# Patient Record
Sex: Female | Born: 1955 | Race: White | Hispanic: No | Marital: Married | Smoking: Never smoker
Health system: Southern US, Community
[De-identification: ages and names within clinical notes are randomized; demographics above are authoritative.]

## PROBLEM LIST (undated history)

## (undated) DIAGNOSIS — K219 Gastro-esophageal reflux disease without esophagitis: Secondary | ICD-10-CM

## (undated) DIAGNOSIS — I10 Essential (primary) hypertension: Secondary | ICD-10-CM

## (undated) DIAGNOSIS — Z9889 Other specified postprocedural states: Secondary | ICD-10-CM

## (undated) HISTORY — DX: Gastro-esophageal reflux disease without esophagitis: K21.9

## (undated) HISTORY — DX: Essential (primary) hypertension: I10

## (undated) HISTORY — PX: CHOLECYSTECTOMY: SHX55

## (undated) HISTORY — PX: OTHER SURGICAL HISTORY: SHX169

---

## 2002-12-10 ENCOUNTER — Other Ambulatory Visit: Admission: RE | Admit: 2002-12-10 | Discharge: 2002-12-10 | Payer: Self-pay | Admitting: Gynecology

## 2003-01-13 ENCOUNTER — Encounter: Admission: RE | Admit: 2003-01-13 | Discharge: 2003-01-13 | Payer: Self-pay | Admitting: Gynecology

## 2003-01-13 ENCOUNTER — Encounter: Payer: Self-pay | Admitting: Gynecology

## 2003-07-16 ENCOUNTER — Encounter: Admission: RE | Admit: 2003-07-16 | Discharge: 2003-07-16 | Payer: Self-pay | Admitting: Obstetrics and Gynecology

## 2003-11-28 ENCOUNTER — Ambulatory Visit (HOSPITAL_COMMUNITY): Admission: RE | Admit: 2003-11-28 | Discharge: 2003-11-28 | Payer: Self-pay

## 2008-08-18 ENCOUNTER — Ambulatory Visit: Payer: Self-pay | Admitting: Diagnostic Radiology

## 2008-08-18 ENCOUNTER — Encounter: Payer: Self-pay | Admitting: Emergency Medicine

## 2008-08-19 ENCOUNTER — Inpatient Hospital Stay (HOSPITAL_COMMUNITY): Admission: EM | Admit: 2008-08-19 | Discharge: 2008-08-23 | Payer: Self-pay | Admitting: Internal Medicine

## 2008-08-22 ENCOUNTER — Encounter (INDEPENDENT_AMBULATORY_CARE_PROVIDER_SITE_OTHER): Payer: Self-pay | Admitting: Surgery

## 2010-11-27 LAB — DIFFERENTIAL
Basophils Absolute: 0.1 10*3/uL (ref 0.0–0.1)
Basophils Absolute: 0.1 10*3/uL (ref 0.0–0.1)
Basophils Absolute: 0.1 10*3/uL (ref 0.0–0.1)
Basophils Absolute: 0.6 10*3/uL — ABNORMAL HIGH (ref 0.0–0.1)
Basophils Relative: 0 % (ref 0–1)
Basophils Relative: 2 % — ABNORMAL HIGH (ref 0–1)
Eosinophils Absolute: 0 10*3/uL (ref 0.0–0.7)
Eosinophils Absolute: 0 10*3/uL (ref 0.0–0.7)
Eosinophils Relative: 0 % (ref 0–5)
Eosinophils Relative: 0 % (ref 0–5)
Eosinophils Relative: 0 % (ref 0–5)
Lymphocytes Relative: 4 % — ABNORMAL LOW (ref 12–46)
Lymphocytes Relative: 4 % — ABNORMAL LOW (ref 12–46)
Lymphocytes Relative: 5 % — ABNORMAL LOW (ref 12–46)
Lymphocytes Relative: 7 % — ABNORMAL LOW (ref 12–46)
Lymphs Abs: 0.8 10*3/uL (ref 0.7–4.0)
Lymphs Abs: 1 10*3/uL (ref 0.7–4.0)
Lymphs Abs: 1 10*3/uL (ref 0.7–4.0)
Lymphs Abs: 1.2 10*3/uL (ref 0.7–4.0)
Monocytes Absolute: 0.6 10*3/uL (ref 0.1–1.0)
Monocytes Absolute: 0.6 10*3/uL (ref 0.1–1.0)
Monocytes Relative: 2 % — ABNORMAL LOW (ref 3–12)
Monocytes Relative: 3 % (ref 3–12)
Neutro Abs: 13.2 10*3/uL — ABNORMAL HIGH (ref 1.7–7.7)
Neutro Abs: 13.3 10*3/uL — ABNORMAL HIGH (ref 1.7–7.7)
Neutro Abs: 20.5 10*3/uL — ABNORMAL HIGH (ref 1.7–7.7)
Neutro Abs: 27.6 10*3/uL — ABNORMAL HIGH (ref 1.7–7.7)
Neutrophils Relative %: 89 % — ABNORMAL HIGH (ref 43–77)
Neutrophils Relative %: 92 % — ABNORMAL HIGH (ref 43–77)
Neutrophils Relative %: 92 % — ABNORMAL HIGH (ref 43–77)

## 2010-11-27 LAB — COMPREHENSIVE METABOLIC PANEL
ALT: 19 U/L (ref 0–35)
ALT: 23 U/L (ref 0–35)
ALT: 36 U/L — ABNORMAL HIGH (ref 0–35)
ALT: 36 U/L — ABNORMAL HIGH (ref 0–35)
AST: 104 U/L — ABNORMAL HIGH (ref 0–37)
AST: 28 U/L (ref 0–37)
AST: 43 U/L — ABNORMAL HIGH (ref 0–37)
Albumin: 2.9 g/dL — ABNORMAL LOW (ref 3.5–5.2)
Albumin: 3.5 g/dL (ref 3.5–5.2)
Albumin: 4.9 g/dL (ref 3.5–5.2)
Alkaline Phosphatase: 146 U/L — ABNORMAL HIGH (ref 39–117)
Alkaline Phosphatase: 68 U/L (ref 39–117)
Alkaline Phosphatase: 74 U/L (ref 39–117)
Alkaline Phosphatase: 90 U/L (ref 39–117)
BUN: 11 mg/dL (ref 6–23)
BUN: 12 mg/dL (ref 6–23)
BUN: 5 mg/dL — ABNORMAL LOW (ref 6–23)
BUN: 7 mg/dL (ref 6–23)
CO2: 22 mEq/L (ref 19–32)
CO2: 25 mEq/L (ref 19–32)
CO2: 27 mEq/L (ref 19–32)
CO2: 27 mEq/L (ref 19–32)
Calcium: 10.4 mg/dL (ref 8.4–10.5)
Calcium: 7.3 mg/dL — ABNORMAL LOW (ref 8.4–10.5)
Calcium: 8.3 mg/dL — ABNORMAL LOW (ref 8.4–10.5)
Chloride: 102 mEq/L (ref 96–112)
Chloride: 109 mEq/L (ref 96–112)
Chloride: 95 mEq/L — ABNORMAL LOW (ref 96–112)
Chloride: 98 mEq/L (ref 96–112)
Creatinine, Ser: 0.87 mg/dL (ref 0.4–1.2)
Creatinine, Ser: 0.93 mg/dL (ref 0.4–1.2)
Creatinine, Ser: 1.1 mg/dL (ref 0.4–1.2)
GFR calc Af Amer: >60 mL/min (ref >60)
GFR calc Af Amer: >60 mL/min (ref >60)
GFR calc Af Amer: >60 mL/min (ref >60)
GFR calc non Af Amer: 52 mL/min — ABNORMAL LOW (ref >60)
GFR calc non Af Amer: >60 mL/min (ref >60)
GFR calc non Af Amer: >60 mL/min (ref >60)
GFR calc non Af Amer: >60 mL/min (ref >60)
Glucose, Bld: 117 mg/dL — ABNORMAL HIGH (ref 70–99)
Glucose, Bld: 149 mg/dL — ABNORMAL HIGH (ref 70–99)
Glucose, Bld: 166 mg/dL — ABNORMAL HIGH (ref 70–99)
Glucose, Bld: 203 mg/dL — ABNORMAL HIGH (ref 70–99)
Potassium: 3.1 mEq/L — ABNORMAL LOW (ref 3.5–5.1)
Potassium: 3.2 mEq/L — ABNORMAL LOW (ref 3.5–5.1)
Potassium: 3.3 mEq/L — ABNORMAL LOW (ref 3.5–5.1)
Potassium: 4.2 mEq/L (ref 3.5–5.1)
Sodium: 135 mEq/L (ref 135–145)
Sodium: 136 mEq/L (ref 135–145)
Sodium: 138 mEq/L (ref 135–145)
Sodium: 139 mEq/L (ref 135–145)
Total Bilirubin: 0.6 mg/dL (ref 0.3–1.2)
Total Bilirubin: 0.9 mg/dL (ref 0.3–1.2)
Total Bilirubin: 1.1 mg/dL (ref 0.3–1.2)
Total Bilirubin: 1.6 mg/dL — ABNORMAL HIGH (ref 0.3–1.2)
Total Protein: 5.8 g/dL — ABNORMAL LOW (ref 6.0–8.3)
Total Protein: 6.4 g/dL (ref 6.0–8.3)
Total Protein: 9 g/dL — ABNORMAL HIGH (ref 6.0–8.3)

## 2010-11-27 LAB — CBC
HCT: 29.2 % — ABNORMAL LOW (ref 36.0–46.0)
HCT: 29.8 % — ABNORMAL LOW (ref 36.0–46.0)
HCT: 30.9 % — ABNORMAL LOW (ref 36.0–46.0)
HCT: 36.1 % (ref 36.0–46.0)
HCT: 39.7 % (ref 36.0–46.0)
HCT: 46.7 % — ABNORMAL HIGH (ref 36.0–46.0)
Hemoglobin: 10 g/dL — ABNORMAL LOW (ref 12.0–15.0)
Hemoglobin: 10.3 g/dL — ABNORMAL LOW (ref 12.0–15.0)
Hemoglobin: 10.9 g/dL — ABNORMAL LOW (ref 12.0–15.0)
Hemoglobin: 12.4 g/dL (ref 12.0–15.0)
Hemoglobin: 13.4 g/dL (ref 12.0–15.0)
Hemoglobin: 15.6 g/dL — ABNORMAL HIGH (ref 12.0–15.0)
MCHC: 33.3 g/dL (ref 30.0–36.0)
MCHC: 33.7 g/dL (ref 30.0–36.0)
MCHC: 34.2 g/dL (ref 30.0–36.0)
MCHC: 34.4 g/dL (ref 30.0–36.0)
MCV: 90.1 fL (ref 78.0–100.0)
MCV: 91.1 fL (ref 78.0–100.0)
MCV: 91.2 fL (ref 78.0–100.0)
MCV: 91.2 fL (ref 78.0–100.0)
Platelets: 176 10*3/uL (ref 150–400)
Platelets: 200 10*3/uL (ref 150–400)
Platelets: 213 10*3/uL (ref 150–400)
Platelets: 290 10*3/uL (ref 150–400)
Platelets: 388 10*3/uL (ref 150–400)
RBC: 3.22 MIL/uL — ABNORMAL LOW (ref 3.87–5.11)
RBC: 3.27 MIL/uL — ABNORMAL LOW (ref 3.87–5.11)
RBC: 3.96 MIL/uL (ref 3.87–5.11)
RBC: 4.36 MIL/uL (ref 3.87–5.11)
RBC: 5.18 MIL/uL — ABNORMAL HIGH (ref 3.87–5.11)
RDW: 11.7 % (ref 11.5–15.5)
RDW: 12.4 % (ref 11.5–15.5)
RDW: 12.6 % (ref 11.5–15.5)
RDW: 12.6 % (ref 11.5–15.5)
RDW: 12.7 % (ref 11.5–15.5)
RDW: 12.7 % (ref 11.5–15.5)
WBC: 14.8 10*3/uL — ABNORMAL HIGH (ref 4.0–10.5)
WBC: 14.8 10*3/uL — ABNORMAL HIGH (ref 4.0–10.5)
WBC: 17.1 10*3/uL — ABNORMAL HIGH (ref 4.0–10.5)
WBC: 22.2 10*3/uL — ABNORMAL HIGH (ref 4.0–10.5)
WBC: 30 10*3/uL — ABNORMAL HIGH (ref 4.0–10.5)

## 2010-11-27 LAB — URINE CULTURE
Colony Count: >=100000
Colony Count: NO GROWTH
Colony Count: NO GROWTH
Culture: NO GROWTH
Culture: NO GROWTH
Special Requests: NEGATIVE
Special Requests: NEGATIVE
Special Requests: POSITIVE

## 2010-11-27 LAB — CARDIAC PANEL(CRET KIN+CKTOT+MB+TROPI)
CK, MB: 1.4 ng/mL (ref 0.3–4.0)
CK, MB: 1.5 ng/mL (ref 0.3–4.0)
Relative Index: INVALID (ref 0.0–2.5)
Relative Index: INVALID (ref 0.0–2.5)
Total CK: 73 U/L (ref 7–177)
Total CK: 84 U/L (ref 7–177)
Troponin I: 0.01 ng/mL (ref 0.00–0.06)
Troponin I: 0.01 ng/mL (ref 0.00–0.06)

## 2010-11-27 LAB — URINALYSIS, ROUTINE W REFLEX MICROSCOPIC
Glucose, UA: NEGATIVE mg/dL
Ketones, ur: 15 mg/dL — AB
Nitrite: NEGATIVE
Protein, ur: >300 mg/dL — AB
Specific Gravity, Urine: 1.019 (ref 1.005–1.030)
Urobilinogen, UA: 1 mg/dL (ref 0.0–1.0)
pH: 6 (ref 5.0–8.0)

## 2010-11-27 LAB — URINE MICROSCOPIC-ADD ON

## 2010-11-27 LAB — BASIC METABOLIC PANEL
BUN: 5 mg/dL — ABNORMAL LOW (ref 6–23)
CO2: 22 mEq/L (ref 19–32)
Calcium: 6.6 mg/dL — ABNORMAL LOW (ref 8.4–10.5)
Calcium: 7 mg/dL — ABNORMAL LOW (ref 8.4–10.5)
Chloride: 105 mEq/L (ref 96–112)
Creatinine, Ser: 0.69 mg/dL (ref 0.4–1.2)
GFR calc Af Amer: >60 mL/min (ref >60)
GFR calc non Af Amer: >60 mL/min (ref >60)
GFR calc non Af Amer: >60 mL/min (ref >60)
Glucose, Bld: 114 mg/dL — ABNORMAL HIGH (ref 70–99)
Glucose, Bld: 125 mg/dL — ABNORMAL HIGH (ref 70–99)
Potassium: 2.7 mEq/L — CL (ref 3.5–5.1)
Potassium: 3.2 mEq/L — ABNORMAL LOW (ref 3.5–5.1)
Sodium: 134 mEq/L — ABNORMAL LOW (ref 135–145)
Sodium: 134 mEq/L — ABNORMAL LOW (ref 135–145)

## 2010-11-27 LAB — TSH: TSH: 0.857 u[IU]/mL (ref 0.350–4.500)

## 2010-11-27 LAB — CULTURE, BLOOD (ROUTINE X 2)
Culture: NO GROWTH
Culture: NO GROWTH

## 2010-11-27 LAB — LIPID PANEL
Cholesterol: 239 mg/dL — ABNORMAL HIGH (ref 0–200)
HDL: 69 mg/dL (ref >39)
LDL Cholesterol: 154 mg/dL — ABNORMAL HIGH (ref 0–99)
Total CHOL/HDL Ratio: 3.5 RATIO
Triglycerides: 79 mg/dL (ref <150)
VLDL: 16 mg/dL (ref 0–40)

## 2010-11-27 LAB — POTASSIUM: Potassium: 2.9 mEq/L — ABNORMAL LOW (ref 3.5–5.1)

## 2010-11-27 LAB — LIPASE, BLOOD
Lipase: 119 U/L — ABNORMAL HIGH (ref 11–59)
Lipase: 19 U/L (ref 11–59)
Lipase: 22 U/L (ref 11–59)
Lipase: 975 U/L — ABNORMAL HIGH (ref 11–59)
Lipase: >2000 U/L — ABNORMAL HIGH (ref 23–300)

## 2010-12-26 NOTE — Op Note (Signed)
Kathy Dickerson, Kathy Dickerson           ACCOUNT NO.:  [id number]   MEDICAL RECORD NO.:  [id number]          PATIENT TYPE:  INP   LOCATION:  0160                         FACILITY:  [facility]   PHYSICIAN:  Sandria Bales. Ezzard Standing, M.D.  DATE OF BIRTH:  04-23-1956   DATE OF PROCEDURE:  08/22/2008  DATE OF DISCHARGE:                               OPERATIVE REPORT   Date of Surgery ?   PREOPERATIVE DIAGNOSIS:  Gallstone pancreatitis.   POSTOPERATIVE DIAGNOSES:  1. Gallstone pancreatitis.  2. Chronic cholecystitis.  3. Small amount of bile-stained peritoneal fluid.   PROCEDURE:  Laparoscopic cholecystectomy with intraoperative  cholangiogram (5-port).   SURGEON:  Sandria Bales. Ezzard Standing, M.D.   FIRST ASSISTANT:  Velora Heckler, M.D.   ANESTHESIA:  General endotracheal.   ESTIMATED BLOOD LOSS:  Minimal.   INDICATIONS FOR PROCEDURE:  Kathy Dickerson is a 55 year old white female  patient of Dr. Dewaine Oats who presented on August 19, 2008, with acute  gallstone pancreatitis and around 2000.  Dr. Darnell Level saw her on  August 20, 2008.  Her amylase rapidly returned to normal.  She has had a  persistent leukocytosis of 14,000.  Her physical exam is better and I  discussed with her about proceeding with cholecystectomy while she is in  the hospital.   I discussed with her and her husband the indications, potential  complications.  Potential complications include, but are not limited to,  bleeding, infection, bile duct injury and the possibility of open  surgery.   OPERATIVE NOTE:  The patient placed in a supine position, given a  general endotracheal anesthetic, this was supervised by Dr. Frutoso Chase.  She was already on Cipro as antibiotic.  Her abdomen was  prepped with Betadine solution and sterilely draped. A time out was held  to identify the patient and the procedure.   An infraumbilical incision was made with sharp dissection and carried  down into the abdominal cavity.  A 0 degree 10-mm  laparoscope was  inserted through the umbilicus.  I then placed a 10-mm subxiphoid  location and three more 5-mm, so a 5-mm midway between the umbilicus and  her sternum, a 5-mm subcostal and a 5-mm lateral subcostal.   Abdominal exploration was carried out.  Her right and left lobes of  liver are unremarkable.  She did have 200 or 300 mL of sort of a bile-  stained peritoneal fluid over the right lobe of the liver and I guess  this is due her pancreatitis.  She also had some saponification of fat  along anterior abdominal wall, consistent with pancreatitis.  She had  dilated bowel which limited the ability to see the rest of her bowel.   I then grabbed the gallbladder.  She had adhesions along the gallbladder  with a somewhat thickened gallbladder wall, consistent with chronic  cholecystitis.  The medial lobe of her left lobe of liver kept flopping  in the way during the dissection, that is why I put the fifth port in,  to help with exposure.  I found a cystic artery which I doubly endo-  clipped and divided.  I then shot an intraoperative cholangiogram.   The intraoperative cholangiogram was shot using a cutoff taut catheter  inserted through a 14-gauge Jelco into the side of the cut cystic duct.  I shot about 10 mL of half-strength Hypaque solution under fluoroscopy,  showing free flow of contrast down the cystic duct to the common bile  duct to the duodenum and it refluxed up to the hepatic radicals.  There  was no filling defect and this was felt to be a normal intraoperative  cholangiogram.   The taut catheter was then removed, the cystic duct triply endo-clipped  and divided.  I then divided the gallbladder from the gallbladder bed  with sharp and blunt dissection.  Prior to completing division of the  gallbladder from the gallbladder bed, I revisualized the triangle of  Calot, I revisualized the gallbladder bed and saw no bleeding, no bile  leak.  I then put the gallbladder in  an Endopouch and delivered it  through the umbilicus.  I closed the umbilical port with two 2-0 Vicryl  sutures.  There was no bleeding at any trocar site.  The skin at each  site was closed with a 5-0 Vicryl suture, painted with Tincture of  benzoin and Steri-Strips.   The patient tolerated the procedure well, was transported to the  recovery room in good condition.      Sandria Bales. Ezzard Standing, M.D.  Electronically Signed     DHN/MEDQ  D:  08/22/2008  T:  08/22/2008  Job:  [id number]   cc:   [name], M.D.   Robert L. Foy Guadalajara, M.D.  Fax: [phone]

## 2010-12-26 NOTE — H&P (Signed)
Kathy Dickerson, AREOLA           ACCOUNT NO.:  [id number]   MEDICAL RECORD NO.:  [id number]          PATIENT TYPE:  INP   LOCATION:  1236                         [facility]ACILITY[facility]  [facility]   PHYSICIAN:  Michiel Cowboy, MDDATE OF BIRTH:  [birth year]   DATE OF ADMISSION:  08/19/2008  DATE OF DISCHARGE:                              HISTORY & PHYSICAL   PRIMARY CARE Shed Nixon:  Dr. Foy Guadalajara.   CHIEF COMPLAINT:  Abdominal pain.   The patient is a 55 year old female with a history of gallstones in the  past with positive HIDA scan in the past who was at her baseline of  health up until yesterday morning when she started to nausea, vomiting  and severe abdominal pain, mild diarrhea.  Abdominal pain was epigastric  in localization.  She had vomiting if she would eat anything.  No fevers  no chills.  No burning urination.  No shortness of breath or chest pain.  Otherwise, review of systems unremarkable.   PAST MEDICAL HISTORY:  Significant for:  1. Hypertension.  2. GERD.  3. Hiatal hernia.   SOCIAL HISTORY:  The patient does not smoke or use drugs.  Does not  drink alcohol.   FAMILY HISTORY:  Noncontributory.   ALLERGY:  To SULFA.   MEDICATIONS:  1. Prevacid 30 mg daily.  2. Mavik 2 mg daily.  3. Ditropan 5 mg p.o. daily.  4. Hydrochlorothiazide 25 mg p.o. daily.  5. Estradiol 1/0.5 daily.  6. Miconazole intravaginal as needed.   VITAL SIGNS:  INITIAL VITALS:  Temperature 98.2, blood pressure 118/72,  pulse 121, respirations 20, saturating 99% on room air.  Now, heart rate  99, O2 saturation 98, blood pressure 132/62, respirations 28.   PHYSICAL EXAMINATION:  The patient appears to be at her stated age.  HEAD:  Nontraumatic.  Dry mucous membranes.  Decreased skin turgor.  LUNGS:  Clear to auscultation bilaterally.  No wheezes, no crackles.  HEART:  Regular rate and rhythm, somewhat rapid.  No murmurs  appreciated.  ABDOMEN:  Epigastric tenderness noted, diminished breath sounds,  no  rebound, no guarding, no rigidity.  LOWER EXTREMITIES:  Without clubbing, cyanosis or edema.  Strength 5/5  in all 4 extremities, appears to be neurologically intact.   LABORATORY DATA:  White blood cell count 30, hemoglobin 15.6.  Sodium  139, potassium 3.1, creatinine 1.1, blood glucose 203, total bilirubin  1.6, AST 104, ALT 36, alkaline phosphatase 146, total protein 9.0,  lipase above 2000.  UA shows 10 to 10 red blood cells and 3 to 6 white  blood cells and few bacteria.  CT scan of abdomen showed pancreatitis,  cholelithiasis but no cholecystitis.   ASSESSMENT AND PLAN:  This is a 55 year old female with pancreatitis,  likely gallbladder stone related.   1. Pancreatitis.  Will admit to step-down given elevated white blood      cell count and possibly severe disease.  Will make n.p.o.  Give IV      fluids.  Recommend GI consult.  This is possibly related to      gallstones.  Hold hydrochlorothiazide as this could contribute to  pancreatitis.  Check fasting lipid panel to make sure she does not      have hypertriglyceridemia.  Protonix IV for prophylaxis.  2. History of hypertension.  Hold hydrochlorothiazide.  3. Prophylaxis.  Protonix plus SCDs.  4. Mild urinary tract infection.  Will cover with Cipro for now.      Await urine culture; if negative, will stop.      Michiel Cowboy, MD  Electronically Signed     AVD/MEDQ  D:  08/19/2008  T:  08/19/2008  Job:  [id number]   cc:   Molly Maduro L. Foy Guadalajara, M.D.  Fax: [phone]

## 2010-12-26 NOTE — Discharge Summary (Signed)
NAME:  Kathy Dickerson, Kathy Dickerson           ACCOUNT NO.:  [id number]   MEDICAL RECORD NO.:  [id number]          PATIENT TYPE:  INP   LOCATION:  0160                         FACILITY:  [facility]   PHYSICIAN:  [name], M.D.DATE OF BIRTH:  10-26-1955   DATE OF ADMISSION:  08/19/2008  DATE OF DISCHARGE:  08/23/2008                               DISCHARGE SUMMARY   DISCHARGE DIAGNOSES:  1. Gallstone pancreatitis.  2. Chronic cholecystitis.  3. Cystitis.  4. Hypertension.  5. Gastroesophageal reflux disease.  6. History of hiatal hernia.  7. Obesity.  8. Hypokalemia - resolved.   PROCEDURES AND STUDIES:  1. CT scan of the abdomen with contrast - acute pancreatitis involving      the body and tail of the pancreas, no evidence of pancreatic      necrosis.  Cholelithiasis without CT evidence of acute      cholecystitis or biliary ductal dilatation.  2. Laparoscopic cholecystectomy with intraoperative cholangiogram per      Dr. Gerrit Friends and Dr. Ezzard Standing.   CONSULTATION:  [place] [facility], Dr. Gerrit Friends.   BRIEF HISTORY:  The patient is a 55 year old white female with above  listed medical problems who presented with complaints of abdominal pain  - described as epigastric in location and associated with nausea and  vomiting.  She denied dysuria, fevers and no shortness of breath.  She  was seen at the [facility] Emergency Room and a CT scan of her abdomen  and pelvis done and the results as stated above.  She was admitted to  [facility] for further evaluation and management.   Please see the full admission history and physical dictated by Dr.  Adela Dickerson on August 19, 2008 for the details of the admission physical  exam as well as the laboratory data.   HOSPITAL COURSE:  1. Gallstone pancreatitis - upon admission the patient was kept NPO      and started on IV analgesics for pain management.  A CT scan of her      abdomen was done prior to transfer to [facility] and the  results as stated above.  Her lipase was elevated at greater than      2000 and her LFTs slightly elevated with an SGOT of 43 and SGPT of      36, the other LFTs within normal limits.  With the above      interventions her symptoms improved and her lipase gradually came      down as well - on last recheck on August 21, 2008, it had come down      to 22 and her LFTs had normalized.  Surgery was consulted and Dr.      Gerrit Friends saw the patient and she had a laparoscopic cholecystectomy      done on August 22, 2008.  Per Dr. Ezzard Standing who did the surgery, a      small amount of bile-stained peritoneal fluid was noted.  The      intraoperative cholangiogram done showed patent biliary system      without filling defects or stones.  She has done well  following her      surgery and is tolerating p.o.'s well at this time and Dr. Ezzard Standing      rounded on her today and has indicated that it was okay for her to      be discharged from the surgery standpoint.  She will be discharged      on a low fat, low cholesterol diet as instructed per surgery, and      she is to follow up with Dr. Ezzard Standing in 2 - 3 weeks.  Her      hydrochlorothiazide was also discontinued while she was in the      hospital.  2. Hypokalemia - her potassium was replaced in the hospital.  3. Cystitis - a urinalysis done on admission was consistent with a      urinary tract infection and she was empirically placed on      antibiotics.  The urine culture grew multiple bacterial morphotypes      (contaminant, repeat urine culture was done and this was negative).      The patient received adequate antibiotics while in the hospital for      cystitis and will not require any further antibiotics upon      discharge.  4. Hypertension - the patient had been on trandolapril and HCTZ prior      to admission.  These medications were held while she was in the      hospital and her blood pressures were monitored and managed      accordingly.  Given  that HCTZ is also a known cause of pancreatitis      this has been discontinued.  The patient is to continue her      trandolapril upon discharge.  She is to follow up with her primary      care physician.  5. Gastroesophageal reflux disease - the patient is to continue her      Prevacid upon discharge.   DISCHARGE MEDICATIONS:  1. Vicodin 1 - 2 tablets q.6 hours p.r.n.  2. The patient is to continue Prevacid 15 mg daily.  3. Trandolapril 2 mg p.o. daily.  4. Oxybutynin 5 mg p.o. daily.  5. Estradiol as previously.   FOLLOW-UP CARE:  1. Dr. Ezzard Standing in 2 - 3 weeks, the patient to call for appointment.  2. Dr. Dewaine Oats in 1-2 weeks, the patient to call for appointment.   DISCHARGE CONDITION:  Improved/Stable.   DISCHARGE DIET:  Low fat, low cholesterol diet.      [name], M.D.  Electronically Signed     ACV/MEDQ  D:  08/23/2008  T:  08/23/2008  Job:  [id number]   cc:   Molly Maduro L. Foy Guadalajara, M.D.  Fax: [phone]   Sandria Bales. Ezzard Standing, M.D.  [address]  [place]  [place] [address]

## 2010-12-26 NOTE — Consult Note (Signed)
[name], [name]           ACCOUNT NO.:  [id number]   MEDICAL RECORD NO.:  [id number]          PATIENT TYPE:  INP   LOCATION:  0160                         FACILITY:  [facility]   PHYSICIAN:  Velora Heckler, MD      DATE OF BIRTH:  Jan 01, 1956   DATE OF CONSULTATION:  08/20/2008  DATE OF DISCHARGE:                                 CONSULTATION   REFERRING PHYSICIAN:  Dr. Valetta Close Team.   PRIMARY CARE PHYSICIAN:  Dr. Marinda Elk, [facility]   CHIEF COMPLAINT:  Abdominal pain, nausea, vomiting, gallstones,  pancreatitis.   HISTORY OF PRESENT ILLNESS:  Rheagan Kathy Dickerson is a 55 year old white  female admitted on the medical service on August 18, 2008 with abdominal  pain, nausea and vomiting.  Workup revealed an elevated lipase level.  CT scan abdomen and pelvis confirmed acute pancreatitis involving the  body and tail of the pancreas.  Fluid was noted in the perirenal space  and in the left colic gutter.  Gallstones were identified but no acute  inflammatory changes of the gallbladder were noted.  The patient was  admitted on the medical service and started on intravenous fluids.  General Surgery is consulted regarding cholecystectomy during this  admission.   PAST MEDICAL HISTORY:  1. History of hypertension.  2. History of gastroesophageal reflux.  3. History of hiatal hernia.  4. History of biliary dyskinesia.   MEDICATIONS:  1. Prevacid.  2. Mavik.  3. Ditropan.  4. Hydrochlorothiazide.  5. Estradiol   ALLERGIES:  To SULFA.   SOCIAL HISTORY:  The patient is married.  She is accompanied by her  husband.  She denies tobacco or alcohol use.  She lives in [place].   FAMILY HISTORY:  Noncontributory.   REVIEW OF SYSTEMS:  A 15 system review without significant other  findings except as noted above.   EXAM:  GENERAL: A 55 year old ill-appearing white female in [facility].  Temperature 98.7, maximum temperature last 24  hours 101.5, pulse 103,  respirations 28, blood pressure 115/64, O2 saturation 89% room air.  HEENT:  Shows her to be normocephalic, atraumatic.  Sclerae clear.  Conjunctiva clear.  Dentition fair.  Mucous membranes moist.  Voice  normal.  NECK:  Palpation of the neck shows no lymphadenopathy.  No thyroid  nodularity.  No tenderness.  LUNGS:  Clear to auscultation bilaterally without rales, rhonchi or  wheeze.  CARDIAC:  Exam shows regular rate and rhythm without significant murmur.  Peripheral pulses are full.  ABDOMEN:  Soft, obese, mildly distended.  There are rare bowel sounds  present on auscultation.  There is tenderness in the upper abdomen from  the right upper quadrant to the epigastrium to the left upper quadrant,  but maximum in the right upper quadrant.  There is voluntary guarding.  There is no palpable mass.  There is no hepatosplenomegaly.  There are  no surgical wounds.  EXTREMITIES:  Show no sign of edema.  NEUROLOGICALLY:  The patient is alert and oriented without focal  deficit.   LABORATORY STUDIES:  White count 22.2, hemoglobin 12.4, hematocrit  36.1%, platelet count 213,000.  Differential shows 92% segmented  neutrophils.  Potassium is slightly low at 3.2, glucose is slightly high  at 117.  Lipase has fallen to a level of 119 today.   RADIOGRAPHY:  CT scan abdomen and pelvis with signs of acute  pancreatitis as noted above and cholelithiasis.   IMPRESSION:  1. Biliary pancreatitis.  2. Cholelithiasis.  3. Obesity.  4. Hypertension.  5. Hiatal hernia.  6. Gastroesophageal reflux.   RECOMMENDATIONS:  1. Continue n.p.o. status with intravenous hydration and correction of      electrolytes.  2. Daily laboratory monitoring  3. May need repeat CT scan abdomen and pelvis if white blood cell      count remains elevated or hemoglobin continues to fall.  4. Plan for laparoscopic cholecystectomy versus open cholecystectomy      prior to discharge after  pancreatitis resolves.      Velora Heckler, MD  Electronically Signed     TMG/MEDQ  D:  08/20/2008  T:  08/20/2008  Job:  [id number]   cc:   [name], M.D.   Robert L. Foy Guadalajara, M.D.  Fax: [phone]

## 2012-11-15 ENCOUNTER — Other Ambulatory Visit: Payer: Self-pay | Admitting: *Deleted

## 2012-11-15 ENCOUNTER — Ambulatory Visit: Payer: Self-pay | Admitting: Emergency Medicine

## 2012-11-15 VITALS — BP 166/95 | HR 102 | Temp 98.8°F | Resp 16 | Ht 63.0 in | Wt 209.4 lb

## 2012-11-15 DIAGNOSIS — J018 Other acute sinusitis: Secondary | ICD-10-CM

## 2012-11-15 DIAGNOSIS — J209 Acute bronchitis, unspecified: Secondary | ICD-10-CM

## 2012-11-15 MED ORDER — AMOXICILLIN-POT CLAVULANATE 400-57 MG PO CHEW: 2.0000 | CHEWABLE_TABLET | Freq: Two times a day (BID) | ORAL | Status: DC

## 2012-11-15 MED ORDER — AMOXICILLIN-POT CLAVULANATE 400-57 MG/5ML PO SUSR: 800.0000 mg | Freq: Two times a day (BID) | ORAL | Status: AC

## 2012-11-15 MED ORDER — HYDROCOD POLST-CHLORPHEN POLST 10-8 MG/5ML PO LQCR: 5.0000 mL | Freq: Two times a day (BID) | ORAL | Status: DC | PRN

## 2012-11-15 NOTE — Addendum Note (Signed)
Addended by: Morrell Riddle on: 11/15/2012 04:22 PM   Modules accepted: Orders

## 2012-11-15 NOTE — Progress Notes (Signed)
[facility] [address], [place] [address] [phone]  Date:  11/15/2012   Name:  Kathy Dickerson   DOB:  1956-01-26   MRN:  [id number]  PCP:  No PCP Per Patient    Chief Complaint: Cough   History of Present Illness:  Kathy Dickerson is a 57 y.o. very pleasant female patient who presents with the following:  Suddenly ill over past several days.  Has a fever to 102, cough, sore throat and a green nasal drainage.  No nausea or vomiting.  No wheezing or shortness of breath.  Cough not productive.  Has pain and pressure in forehead and cheeks.  Green nasal drainage.  No improvement with over the counter medications or other home remedies. Denies other complaint or health concern today. Son ill at home. No flu shot.  There is no problem list on file for this patient.   No past medical history on file.  Past Surgical History  Procedure Laterality Date  . Cholecystectomy      History  Substance Use Topics  . Smoking status: Never Smoker   . Smokeless tobacco: Not on file  . Alcohol Use: No    Family History  Problem Relation Age of Onset  . Hypertension Mother   . COPD Mother   . Heart disease Mother   . Cancer Brother     Allergies  Allergen Reactions  . Erythromycin Nausea And Vomiting    Medication list has been reviewed and updated.  No current outpatient prescriptions on file prior to visit.   No current facility-administered medications on file prior to visit.    Review of Systems:  As per HPI, otherwise negative.    Physical Examination: Filed Vitals:   11/15/12 1417  BP: 166/95  Pulse: 102  Temp: 98.8 F (37.1 C)  Resp: 16   Filed Vitals:   11/15/12 1417  Height: 5\' 3"  (1.6 m)  Weight: 209 lb 6.4 oz (94.983 kg)   Body mass index is 37.1 kg/(m^2). Ideal Body Weight: Weight in (lb) to have BMI = 25: 140.8  GEN: WDWN, NAD, Non-toxic, A & O x 3 HEENT: Atraumatic, Normocephalic. Neck supple. No masses, No  LAD. Ears and Nose: No external deformity. CV: RRR, No M/G/R. No JVD. No thrill. No extra heart sounds. PULM: CTA B, no wheezes, crackles, rhonchi. No retractions. No resp. distress. No accessory muscle use. ABD: S, NT, ND, +BS. No rebound. No HSM. EXTR: No c/c/e NEURO Normal gait.  PSYCH: Normally interactive. Conversant. Not depressed or anxious appearing.  Calm demeanor.    Assessment and Plan: Sinusitis Bronchitis augmentin mucinex d tussionex   Signed,  Phillips Odor, MD

## 2012-11-15 NOTE — Patient Instructions (Signed)

## 2013-04-30 ENCOUNTER — Telehealth: Payer: Self-pay | Admitting: Cardiology

## 2013-12-22 NOTE — Telephone Encounter (Signed)
Close encounter 

## 2018-05-14 DIAGNOSIS — I10 Essential (primary) hypertension: Secondary | ICD-10-CM | POA: Diagnosis not present

## 2018-05-14 DIAGNOSIS — J0120 Acute ethmoidal sinusitis, unspecified: Secondary | ICD-10-CM | POA: Diagnosis not present

## 2018-05-14 DIAGNOSIS — M25552 Pain in left hip: Secondary | ICD-10-CM | POA: Diagnosis not present

## 2018-05-20 DIAGNOSIS — I10 Essential (primary) hypertension: Secondary | ICD-10-CM | POA: Diagnosis not present

## 2018-05-20 DIAGNOSIS — R51 Headache: Secondary | ICD-10-CM | POA: Diagnosis not present

## 2018-05-20 DIAGNOSIS — M79605 Pain in left leg: Secondary | ICD-10-CM | POA: Diagnosis not present

## 2018-05-30 DIAGNOSIS — Z130 Encounter for screening for diseases of the blood and blood-forming organs and certain disorders involving the immune mechanism: Secondary | ICD-10-CM | POA: Diagnosis not present

## 2018-05-30 DIAGNOSIS — Z13220 Encounter for screening for lipoid disorders: Secondary | ICD-10-CM | POA: Diagnosis not present

## 2018-05-30 DIAGNOSIS — Z131 Encounter for screening for diabetes mellitus: Secondary | ICD-10-CM | POA: Diagnosis not present

## 2018-05-30 DIAGNOSIS — M7062 Trochanteric bursitis, left hip: Secondary | ICD-10-CM | POA: Diagnosis not present

## 2018-05-30 DIAGNOSIS — I10 Essential (primary) hypertension: Secondary | ICD-10-CM | POA: Diagnosis not present

## 2018-05-30 DIAGNOSIS — M25552 Pain in left hip: Secondary | ICD-10-CM | POA: Diagnosis not present

## 2018-09-10 ENCOUNTER — Telehealth: Payer: Self-pay | Admitting: Cardiovascular Disease

## 2018-09-10 NOTE — Telephone Encounter (Signed)
Did not need this encounter °

## 2020-01-06 ENCOUNTER — Other Ambulatory Visit: Payer: Self-pay | Admitting: Family Medicine

## 2020-01-06 DIAGNOSIS — Z1239 Encounter for other screening for malignant neoplasm of breast: Secondary | ICD-10-CM

## 2021-06-15 ENCOUNTER — Ambulatory Visit: Payer: Self-pay | Attending: Internal Medicine

## 2021-06-15 ENCOUNTER — Other Ambulatory Visit (HOSPITAL_BASED_OUTPATIENT_CLINIC_OR_DEPARTMENT_OTHER): Payer: Self-pay

## 2021-06-15 DIAGNOSIS — Z23 Encounter for immunization: Secondary | ICD-10-CM

## 2021-06-15 MED ORDER — INFLUENZA VAC A&B SA ADJ QUAD 0.5 ML IM PRSY
PREFILLED_SYRINGE | INTRAMUSCULAR | 0 refills | Status: DC
  Filled 2021-06-15: qty 0.5, 1d supply, fill #0

## 2021-06-15 NOTE — Progress Notes (Signed)
   Covid-19 Vaccination Clinic  Name:  Kathy Dickerson    MRN: [id number] DOB: 1956-07-21  06/15/2021  [name] was observed post Covid-19 immunization for 15 minutes without incident. She was provided with Vaccine Information Sheet and instruction to access the V-Safe system.   [name] was instructed to call 911 with any severe reactions post vaccine: Difficulty breathing  Swelling of face and throat  A fast heartbeat  A bad rash all over body  Dizziness and weakness   Immunizations Administered     Name Date Dose VIS Date Route   Pfizer Covid-19 Vaccine Bivalent Booster 06/15/2021 10:30 AM 0.3 mL 04/12/2021 Intramuscular   Manufacturer: [employer]   Lot: [id number]   [place]: [phone]

## 2021-07-04 ENCOUNTER — Other Ambulatory Visit (HOSPITAL_BASED_OUTPATIENT_CLINIC_OR_DEPARTMENT_OTHER): Payer: Self-pay

## 2021-07-04 MED ORDER — PFIZER COVID-19 VAC BIVALENT 30 MCG/0.3ML IM SUSP
INTRAMUSCULAR | 0 refills | Status: DC
  Filled 2021-07-04: qty 0.3, 1d supply, fill #0

## 2021-08-24 ENCOUNTER — Other Ambulatory Visit: Payer: Self-pay | Admitting: Family Medicine

## 2021-08-24 DIAGNOSIS — Z1231 Encounter for screening mammogram for malignant neoplasm of breast: Secondary | ICD-10-CM

## 2021-09-07 ENCOUNTER — Ambulatory Visit
Admission: RE | Admit: 2021-09-07 | Discharge: 2021-09-07 | Disposition: A | Discharge status: Home / Self Care | Payer: Medicare Other | Source: Ambulatory Visit | Attending: Family Medicine | Admitting: Family Medicine

## 2021-09-07 DIAGNOSIS — Z1231 Encounter for screening mammogram for malignant neoplasm of breast: Secondary | ICD-10-CM

## 2021-09-11 ENCOUNTER — Other Ambulatory Visit: Payer: Self-pay | Admitting: Family Medicine

## 2021-09-11 DIAGNOSIS — R928 Other abnormal and inconclusive findings on diagnostic imaging of breast: Secondary | ICD-10-CM

## 2021-09-21 ENCOUNTER — Ambulatory Visit: Payer: Medicare Other

## 2021-09-21 ENCOUNTER — Other Ambulatory Visit: Payer: Self-pay | Admitting: Family Medicine

## 2021-09-21 ENCOUNTER — Other Ambulatory Visit: Payer: Medicare Other

## 2021-09-21 ENCOUNTER — Ambulatory Visit
Admission: RE | Admit: 2021-09-21 | Discharge: 2021-09-21 | Disposition: A | Discharge status: Home / Self Care | Payer: Medicare Other | Source: Ambulatory Visit | Attending: Family Medicine | Admitting: Family Medicine

## 2021-09-21 DIAGNOSIS — R928 Other abnormal and inconclusive findings on diagnostic imaging of breast: Secondary | ICD-10-CM

## 2021-09-21 DIAGNOSIS — R921 Mammographic calcification found on diagnostic imaging of breast: Secondary | ICD-10-CM

## 2021-10-02 ENCOUNTER — Ambulatory Visit
Admission: RE | Admit: 2021-10-02 | Discharge: 2021-10-02 | Disposition: A | Discharge status: Home / Self Care | Payer: Medicare Other | Source: Ambulatory Visit | Attending: Family Medicine | Admitting: Family Medicine

## 2021-10-02 ENCOUNTER — Other Ambulatory Visit: Payer: Self-pay

## 2021-10-02 DIAGNOSIS — R921 Mammographic calcification found on diagnostic imaging of breast: Secondary | ICD-10-CM

## 2021-10-04 ENCOUNTER — Ambulatory Visit
Admission: RE | Admit: 2021-10-04 | Discharge: 2021-10-04 | Disposition: A | Discharge status: Home / Self Care | Payer: Medicare Other | Source: Ambulatory Visit | Attending: Family Medicine | Admitting: Family Medicine

## 2021-10-04 ENCOUNTER — Telehealth: Payer: Self-pay | Admitting: Hematology and Oncology

## 2021-10-04 DIAGNOSIS — R921 Mammographic calcification found on diagnostic imaging of breast: Secondary | ICD-10-CM

## 2021-10-04 NOTE — Telephone Encounter (Signed)
Spoke to patient to confirm morning clinic appointment for 3/1, packet sent via e-mail

## 2021-10-05 ENCOUNTER — Encounter: Payer: Self-pay | Admitting: *Deleted

## 2021-10-05 DIAGNOSIS — D0511 Intraductal carcinoma in situ of right breast: Secondary | ICD-10-CM

## 2021-10-06 ENCOUNTER — Other Ambulatory Visit: Payer: Medicare Other

## 2021-10-09 NOTE — Progress Notes (Signed)
Radiation Oncology         [phone] ________________________________  Name: Kathy Dickerson        MRN: [id number]  Date of Service: 10/11/2021 DOB: 12-23-1955  CC:Masneri, Adele Barthel, DO  Coralie Keens, MD     REFERRING PHYSICIAN: Coralie Keens, MD   DIAGNOSIS: The encounter diagnosis was Ductal carcinoma in situ (DCIS) of right breast.   HISTORY OF PRESENT ILLNESS: Kathy Dickerson is a 66 y.o. female seen in the multidisciplinary breast clinic for a new diagnosis of right breast cancer. The patient was noted to have calcifications on screening mammogram in both breast. The left sided persistent and were sampled and consistent with [facility] and fibrocystic change on 10/04/21. Her right sided findings were calcifications in the upper inner quadrant measuring 5.5 cm. Biopsies on 10/02/21 of the anterior and posterior extent of these right calcifications were both consistent with high grade DCIS. ER was negative in one of the specimens, and weakly positive in the second; for both PR was negative. She's seen to discuss treatment recommendations of her cancer.     PREVIOUS RADIATION THERAPY: No   PAST MEDICAL HISTORY:  Past Medical History:  Diagnosis Date   GERD (gastroesophageal reflux disease)    Hypertension        PAST SURGICAL HISTORY: Past Surgical History:  Procedure Laterality Date   CHOLECYSTECTOMY     removal of cyst on buttocks       FAMILY HISTORY:  Family History  Problem Relation Age of Onset   Hypertension Mother    COPD Mother    Heart disease Mother    Lung cancer Father    Lung cancer Brother    Lung cancer Brother    Prostate cancer Brother      SOCIAL HISTORY:  reports that she has never smoked. She does not have any smokeless tobacco history on file. She reports that she does not drink alcohol and does not use drugs. The patient is married and lives in [place]. She is accompanied by her husband and they have two adult sons.   ALLERGIES:  Erythromycin   MEDICATIONS:  Current Outpatient Medications  Medication Sig Dispense Refill   Fexofenadine HCl (MUCINEX ALLERGY PO) Take 1 Dose by mouth as needed. Liquid mucinex cold and flu as needed     fluticasone (FLONASE) 50 MCG/ACT nasal spray SMARTSIG:2 Spray(s) Both Nares Daily PRN     loratadine (CLARITIN) 10 MG tablet Take 10 mg by mouth daily.     losartan-hydrochlorothiazide (HYZAAR) 100-25 MG tablet Take 1 tablet by mouth daily.     Multiple Vitamins-Minerals (MULTIVITAMIN WITH IRON-MINERALS) liquid Take by mouth daily.     Omega-3 Fatty Acids (FISH OIL) 1000 MG CAPS 1 capsule     Pantoprazole Sodium (PROTONIX PO) Take 15 mg by mouth 2 (two) times a week. Every 3rd day     No current facility-administered medications for this encounter.     REVIEW OF SYSTEMS: On review of systems, the patient reports that she is doing okay overall. She's nervous about her diagnosis given her father's history of metastatic lung cancer. She reports soreness and bruising of her right breast.      PHYSICAL EXAM:  Wt Readings from Last 3 Encounters:  10/11/21 170 lb (77.1 kg)  11/15/12 209 lb 6.4 oz (95 kg)   Temp Readings from Last 3 Encounters:  10/11/21 98.1 F (36.7 C) (Temporal)  11/15/12 98.8 F (37.1 C) (Oral)   BP Readings from Last 3  Encounters:  10/11/21 (!) 187/87  11/15/12 (!) 166/95   Pulse Readings from Last 3 Encounters:  10/11/21 (!) 114  11/15/12 (!) 102    In general this is a well appearing caucasian female in no acute distress. She's alert and oriented x4 and appropriate throughout the examination. Cardiopulmonary assessment is negative for acute distress and she exhibits normal effort. Bilateral breast exam is deferred.    ECOG = 1  0 - Asymptomatic (Fully active, able to carry on all predisease activities without restriction)  1 - Symptomatic but completely ambulatory (Restricted in physically strenuous activity but ambulatory and able to carry out work  of a light or sedentary nature. For example, light housework, office work)  2 - Symptomatic, <50% in bed during the day (Ambulatory and capable of all self care but unable to carry out any work activities. Up and about more than 50% of waking hours)  3 - Symptomatic, >50% in bed, but not bedbound (Capable of only limited self-care, confined to bed or chair 50% or more of waking hours)  4 - Bedbound (Completely disabled. Cannot carry on any self-care. Totally confined to bed or chair)  5 - Death   Eustace Pen MM, Creech RH, Tormey DC, et al. [phone]). "Toxicity and response criteria of the [facility] Group". Kaplan Oncol. 5 (6): 649-55    LABORATORY DATA:  Lab Results  Component Value Date   WBC 6.9 10/11/2021   HGB 12.5 10/11/2021   HCT 37.4 10/11/2021   MCV 93.7 10/11/2021   PLT 251 10/11/2021   Lab Results  Component Value Date   NA 141 10/11/2021   K 3.4 (L) 10/11/2021   CL 105 10/11/2021   CO2 27 10/11/2021   Lab Results  Component Value Date   ALT 15 10/11/2021   AST 17 10/11/2021   ALKPHOS 58 10/11/2021   BILITOT 0.8 10/11/2021      RADIOGRAPHY: MM DIAG BREAST TOMO BILATERAL  Result Date: 09/21/2021 CLINICAL DATA:  Screening recall for bilateral breast calcifications and a right breast asymmetry. EXAM: DIGITAL DIAGNOSTIC BILATERAL MAMMOGRAM WITH TOMOSYNTHESIS AND CAD TECHNIQUE: Bilateral digital diagnostic mammography and breast tomosynthesis was performed. The images were evaluated with computer-aided detection. COMPARISON:  Screening exam, 09/07/2021. There are no available comparison studies. ACR Breast Density Category b: There are scattered areas of fibroglandular density. FINDINGS: In the right breast, there are linear and branching calcifications in the upper inner breast, spanning 5.5 cm from anterior to posterior. There is no associated mass or distortion. The possible asymmetry noted on the screening MLO view disperses with spot compression  imaging consistent with superimposed fibroglandular tissue. There are no defined masses or areas of architectural distortion. In the left breast, there are several groups of calcifications. The most focal of these lies in the lateral breast, spanning 1 cm, composed of small round and punctate calcifications with no associated mass or distortion. There are additional coarse heterogeneous calcifications linearly arranged along the lateral aspect of the breast, more low loosely grouped and less numerous than the calcifications on the right, spanning 4.4 cm anterior to posterior. There are no defined masses or areas of architectural distortion. IMPRESSION: 1. 5.5 cm linear and branching group of calcifications in the upper inner right breast highly suspicious for malignancy. 2. Two groups of suspicious left breast calcifications. RECOMMENDATION: 1. Stereotactic core needle biopsy of the right breast calcifications. Recommend sampling the anterior and posterior extents. 2. Stereotactic core needle biopsy of the 2 groups of left  breast calcifications. These procedures will be scheduled prior to the patient being discharged from the breast Center. These will be scheduled as 1 day for the right breast and a separate a for left breast biopsies. I have discussed the findings and recommendations with the patient. If applicable, a reminder letter will be sent to the patient regarding the next appointment. BI-RADS CATEGORY  5: Highly suggestive of malignancy. Electronically Signed   By: Lajean Manes M.D.   On: 09/21/2021 08:43  MM CLIP PLACEMENT LEFT  Result Date: 10/04/2021 CLINICAL DATA:  Evaluate placement of X shaped and COIL shaped biopsy clips following 3D/stereotactic guided LEFT breast biopsies. EXAM: 3D DIAGNOSTIC LEFT MAMMOGRAM POST STEREOTACTIC BIOPSY COMPARISON:  Previous exam(s). FINDINGS: 3D Mammographic images were obtained following 3D/stereotactic guided biopsy of the 1 cm group of OUTER LEFT breast  calcifications (X clip) and the posterior aspect of the 4.4 cm group of OUTER LEFT breast calcifications (COIL clip). The X biopsy marking clip is in expected position at the site of biopsy. The COIL biopsy marking clip is in expected position at the site of biopsy IMPRESSION: Appropriate positioning of the X shaped biopsy marking clip at the site of biopsy in the OUTER LEFT breast, middle to posterior depth. Appropriate positioning of the COIL shaped biopsy marking clip at the site of biopsy in the OUTER LEFT breast, anterior to middle depth. Final Assessment: Post Procedure Mammograms for Marker Placement Electronically Signed   By: Margarette Canada M.D.   On: 10/04/2021 11:40  MM CLIP PLACEMENT RIGHT  Result Date: 10/02/2021 CLINICAL DATA:  Evaluate placement of COIL and RIBBON biopsy marker clips following 3D/stereotactic guided RIGHT breast biopsies. EXAM: 3D DIAGNOSTIC RIGHT MAMMOGRAM POST STEREOTACTIC BIOPSY COMPARISON:  Previous exam(s). FINDINGS: 3D Mammographic images were obtained following 3D/stereotactic guided biopsy of the posterior extent (COIL clip) and anterior extent (RIBBON clip) of 5.5 cm group of UPPER INNER RIGHT breast calcifications. The COIL biopsy marking clip is located within a small post biopsy hematoma and migrated 1 cm MEDIAL and 2 cm inferior/posterior to the biopsy site with residual calcifications in this area. Residual calcifications can be targeted for localization/bracketing if needed. The RIBBON biopsy marking clip is located 0.5 cm INFERIOR to residual calcifications in the biopsied area. Residual calcifications can be targeted for localization/bracketing if needed. IMPRESSION: COIL clip migration from the biopsy site of posterior extent of UPPER INNER RIGHT breast calcifications. Residual calcifications can be targeted for localization/bracketing if needed. Slight RIBBON clip migration (0.5 cm INFERIOR) from the biopsy site of anterior extent of UPPER INNER RIGHT breast  calcifications. Residual calcifications can be targeted for localization/bracketing if needed. Final Assessment: Post Procedure Mammograms for Marker Placement Electronically Signed   By: Margarette Canada M.D.   On: 10/02/2021 13:59  MM LT BREAST BX W LOC DEV 1ST LESION IMAGE BX SPEC STEREO GUIDE  Addendum Date: 10/06/2021   ADDENDUM REPORT: 10/06/2021 08:36 ADDENDUM: Pathology revealed FOCAL ATYPICAL LOBULAR HYPERPLASIA- FIBROCYSTIC CHANGE WITH CALCIFICATIONS of the LEFT breast, middle posterior depth (x clip). This was found to be concordant by Dr. Hassan Rowan, with surgical consultation for consideration of excision recommended. Pathology revealed FIBROCYSTIC CHANGE WITH CALCIFICATIONS of the LEFT breast, anterior to middle depth (coil clip). This was found to be concordant by Dr. Hassan Rowan. Pathology results were discussed with the patient by telephone. The patient reported doing well after the biopsies with tenderness at the sites. Post biopsy instructions and care were reviewed and questions were answered. The patient was encouraged  to call [facility] for any additional concerns. The patient has a recent diagnosis of right breast cancer and should follow her outlined treatment plan. Pathology results reported by Stacie Acres RN on 10/06/2021. Electronically Signed   By: Margarette Canada M.D.   On: 10/06/2021 08:36   Result Date: 10/06/2021 CLINICAL DATA:  66 year old female for tissue sampling of 2 groups of OUTER LEFT breast calcifications-a 1 cm group at the middle-posterior depth (X clip) and a 4.4 cm group at the anterior-middle depth (COIL clip). Recent diagnosis of RIGHT breast DCIS. EXAM: LEFT BREAST STEREOTACTIC CORE NEEDLE BIOPSY X 2 COMPARISON:  Previous exams. FINDINGS: The patient and I discussed the procedure of stereotactic-guided biopsy including benefits and alternatives. We discussed the high likelihood of a successful procedure. We discussed the risks of the procedure  including infection, bleeding, tissue injury, clip migration, and inadequate sampling. Informed written consent was given. The usual time out protocol was performed immediately prior to the procedure. LEFT BREAST STEREOTACTIC CORE NEEDLE BIOPSY #1 (1 cm group of OUTER LEFT breast calcifications, middle to posterior depth-X clip): Using sterile technique and 1% Lidocaine with and without epinephrine as local anesthetic, under stereotactic guidance, a 9 gauge vacuum assisted device was used to perform core needle biopsy of 1 cm group of OUTER LEFT breast calcifications, middle to posterior depth, using a LATERAL approach. Specimen radiograph was performed showing calcifications. Specimens with calcifications are identified for pathology. At the conclusion of the procedure, an X shaped tissue marker clip was deployed into the biopsy cavity. Follow-up 2-view mammogram was performed and dictated separately. LEFT BREAST STEREOTACTIC CORE NEEDLE BIOPSY #2 (4.4 cm group of OUTER LEFT breast calcifications, anterior to middle depth-COIL clip): Using sterile technique and 1% Lidocaine with and without epinephrine as local anesthetic, under stereotactic guidance, a 9 gauge vacuum assisted device was used to perform core needle biopsy of the 4.4 cm group of OUTER LEFT breast calcifications, anterior to middle depth, using a LATERAL approach. Specimen radiograph was performed showing calcifications. Specimens with calcifications are identified for pathology. At the conclusion of the procedure, a COIL shaped tissue marker clip was deployed into the biopsy cavity. Follow-up 2-view mammogram was performed and dictated separately. IMPRESSION: Stereotactic-guided biopsy of 1 cm group of OUTER LEFT breast calcifications, middle to posterior depth-X clip. Stereotactic guided biopsy of 4.4 cm group of OUTER LEFT breast calcifications, anterior to middle depth-COIL clip. No apparent complications. Electronically Signed: By: Margarette Canada  M.D. On: 10/04/2021 11:31  MM LT BREAST BX W LOC DEV EA AD LESION IMG BX SPEC STEREO GUIDE  Addendum Date: 10/06/2021   ADDENDUM REPORT: 10/06/2021 08:36 ADDENDUM: Pathology revealed FOCAL ATYPICAL LOBULAR HYPERPLASIA- FIBROCYSTIC CHANGE WITH CALCIFICATIONS of the LEFT breast, middle posterior depth (x clip). This was found to be concordant by Dr. Hassan Rowan, with surgical consultation for consideration of excision recommended. Pathology revealed FIBROCYSTIC CHANGE WITH CALCIFICATIONS of the LEFT breast, anterior to middle depth (coil clip). This was found to be concordant by Dr. Hassan Rowan. Pathology results were discussed with the patient by telephone. The patient reported doing well after the biopsies with tenderness at the sites. Post biopsy instructions and care were reviewed and questions were answered. The patient was encouraged to call [facility] for any additional concerns. The patient has a recent diagnosis of right breast cancer and should follow her outlined treatment plan. Pathology results reported by Stacie Acres RN on 10/06/2021. Electronically Signed   By:  Margarette Canada M.D.   On: 10/06/2021 08:36   Result Date: 10/06/2021 CLINICAL DATA:  66 year old female for tissue sampling of 2 groups of OUTER LEFT breast calcifications-a 1 cm group at the middle-posterior depth (X clip) and a 4.4 cm group at the anterior-middle depth (COIL clip). Recent diagnosis of RIGHT breast DCIS. EXAM: LEFT BREAST STEREOTACTIC CORE NEEDLE BIOPSY X 2 COMPARISON:  Previous exams. FINDINGS: The patient and I discussed the procedure of stereotactic-guided biopsy including benefits and alternatives. We discussed the high likelihood of a successful procedure. We discussed the risks of the procedure including infection, bleeding, tissue injury, clip migration, and inadequate sampling. Informed written consent was given. The usual time out protocol was performed immediately prior to the procedure. LEFT  BREAST STEREOTACTIC CORE NEEDLE BIOPSY #1 (1 cm group of OUTER LEFT breast calcifications, middle to posterior depth-X clip): Using sterile technique and 1% Lidocaine with and without epinephrine as local anesthetic, under stereotactic guidance, a 9 gauge vacuum assisted device was used to perform core needle biopsy of 1 cm group of OUTER LEFT breast calcifications, middle to posterior depth, using a LATERAL approach. Specimen radiograph was performed showing calcifications. Specimens with calcifications are identified for pathology. At the conclusion of the procedure, an X shaped tissue marker clip was deployed into the biopsy cavity. Follow-up 2-view mammogram was performed and dictated separately. LEFT BREAST STEREOTACTIC CORE NEEDLE BIOPSY #2 (4.4 cm group of OUTER LEFT breast calcifications, anterior to middle depth-COIL clip): Using sterile technique and 1% Lidocaine with and without epinephrine as local anesthetic, under stereotactic guidance, a 9 gauge vacuum assisted device was used to perform core needle biopsy of the 4.4 cm group of OUTER LEFT breast calcifications, anterior to middle depth, using a LATERAL approach. Specimen radiograph was performed showing calcifications. Specimens with calcifications are identified for pathology. At the conclusion of the procedure, a COIL shaped tissue marker clip was deployed into the biopsy cavity. Follow-up 2-view mammogram was performed and dictated separately. IMPRESSION: Stereotactic-guided biopsy of 1 cm group of OUTER LEFT breast calcifications, middle to posterior depth-X clip. Stereotactic guided biopsy of 4.4 cm group of OUTER LEFT breast calcifications, anterior to middle depth-COIL clip. No apparent complications. Electronically Signed: By: Margarette Canada M.D. On: 10/04/2021 11:31  MM RT BREAST BX W LOC DEV 1ST LESION IMAGE BX SPEC STEREO GUIDE  Addendum Date: 10/04/2021   ADDENDUM REPORT: 10/04/2021 08:56 ADDENDUM: Pathology revealed HIGH GRADE DUCTAL  CARCINOMA IN SITU WITH NECROSIS AND CALCIFICATIONS of the RIGHT breast, posterior extent of 5.5 cm group of UPPER INNER calcifications (coil clip). This was found to be concordant by Dr. Hassan Rowan. Pathology revealed HIGH GRADE DUCTAL CARCINOMA IN SITU WITH NECROSIS AND CALCIFICATIONS of the Right breast, anterior extent of 5.5 cm group of UPPER INNER RIGHT calcifications, (ribbon clip). This was found to be concordant by Dr. Hassan Rowan. Pathology results were discussed with the patient by telephone by Stacie Acres, RN Nurse Navigator. The patient reported doing well after the biopsies with tenderness at the sites. Post biopsy instructions and care were reviewed and questions were answered. The patient was encouraged to call [facility] for any additional concerns. The patient was referred to [facility] on October 11, 2021. The patient is scheduled for LEFT breast stereotactic guided biopsy x 2 on October 04, 2021. Further recommendations will be guided by the results of these biopsies. Consideration for a bilateral breast MRI for  further evaluation of extent of disease given the high grade histology. Pathology results reported by Terie Purser, RN on 10/04/2021. Electronically Signed   By: Margarette Canada M.D.   On: 10/04/2021 08:56   Result Date: 10/04/2021 CLINICAL DATA:  66 year old female for tissue sampling of posterior and anterior extent of 5.5 cm group of suspicious UPPER INNER RIGHT breast calcifications. EXAM: RIGHT BREAST STEREOTACTIC CORE NEEDLE BIOPSY X 2 COMPARISON:  Previous exams. FINDINGS: The patient and I discussed the procedure of stereotactic-guided biopsy including benefits and alternatives. We discussed the high likelihood of a successful procedure. We discussed the risks of the procedure including infection, bleeding, tissue injury, clip migration, and inadequate sampling. Informed written consent  was given. The usual time out protocol was performed immediately prior to the procedure. RIGHT BREAST STEREOTACTIC CORE NEEDLE BIOPSY #1 (posterior extent of 5.5 cm group of UPPER INNER RIGHT breast calcifications-COIL clip): Lesion quadrant: UPPER INNER RIGHT breast Using sterile technique and 1% Lidocaine with and without epinephrine as local anesthetic, under stereotactic guidance, a 9 gauge vacuum assisted device was used to perform core needle biopsy of the posterior extent of the 5.5 cm group of UPPER INNER RIGHT breast calcifications using a SUPERIOR approach. Specimen radiograph was performed showing calcifications. Specimens with calcifications are identified for pathology. At the conclusion of the procedure, a COIL shaped tissue marker clip was deployed into the biopsy cavity. Follow-up 2-view mammogram was performed and dictated separately. RIGHT BREAST STEREOTACTIC CORE NEEDLE BIOPSY #2 (anterior extent of 5.5 cm group of UPPER INNER RIGHT breast calcifications-RIBBON clip): Lesion quadrant: UPPER INNER RIGHT breast Using sterile technique and 1% Lidocaine with and without epinephrine as local anesthetic, under stereotactic guidance, a 9 gauge vacuum assisted device was used to perform core needle biopsy of the anterior extent of the 5.5 cm group of UPPER INNER RIGHT breast calcifications using a SUPERIOR approach. Specimen radiograph was performed showing calcifications. Specimens with calcifications are identified for pathology. At the conclusion of the procedure, a RIBBON shaped tissue marker clip was deployed into the biopsy cavity. Follow-up 2-view mammogram was performed and dictated separately. IMPRESSION: Stereotactic-guided biopsies of posterior extent (COIL clip) and anterior extent (RIBBON clip) of 5.5 cm group of UPPER INNER RIGHT breast calcifications. No apparent complications. Electronically Signed: By: Margarette Canada M.D. On: 10/02/2021 13:59  MM RT BREAST BX W LOC DEV EA AD LESION IMG BX  SPEC STEREO GUIDE  Addendum Date: 10/04/2021   ADDENDUM REPORT: 10/04/2021 08:56 ADDENDUM: Pathology revealed HIGH GRADE DUCTAL CARCINOMA IN SITU WITH NECROSIS AND CALCIFICATIONS of the RIGHT breast, posterior extent of 5.5 cm group of UPPER INNER calcifications (coil clip). This was found to be concordant by Dr. Hassan Rowan. Pathology revealed HIGH GRADE DUCTAL CARCINOMA IN SITU WITH NECROSIS AND CALCIFICATIONS of the Right breast, anterior extent of 5.5 cm group of UPPER INNER RIGHT calcifications, (ribbon clip). This was found to be concordant by Dr. Hassan Rowan. Pathology results were discussed with the patient by telephone by Stacie Acres, RN Nurse Navigator. The patient reported doing well after the biopsies with tenderness at the sites. Post biopsy instructions and care were reviewed and questions were answered. The patient was encouraged to call [facility] for any additional concerns. The patient was referred to [facility] on October 11, 2021. The patient is scheduled for LEFT breast stereotactic guided biopsy x 2 on October 04, 2021. Further recommendations will be guided by  the results of these biopsies. Consideration for a bilateral breast MRI for further evaluation of extent of disease given the high grade histology. Pathology results reported by Terie Purser, RN on 10/04/2021. Electronically Signed   By: Margarette Canada M.D.   On: 10/04/2021 08:56   Result Date: 10/04/2021 CLINICAL DATA:  66 year old female for tissue sampling of posterior and anterior extent of 5.5 cm group of suspicious UPPER INNER RIGHT breast calcifications. EXAM: RIGHT BREAST STEREOTACTIC CORE NEEDLE BIOPSY X 2 COMPARISON:  Previous exams. FINDINGS: The patient and I discussed the procedure of stereotactic-guided biopsy including benefits and alternatives. We discussed the high likelihood of a successful procedure. We discussed the risks  of the procedure including infection, bleeding, tissue injury, clip migration, and inadequate sampling. Informed written consent was given. The usual time out protocol was performed immediately prior to the procedure. RIGHT BREAST STEREOTACTIC CORE NEEDLE BIOPSY #1 (posterior extent of 5.5 cm group of UPPER INNER RIGHT breast calcifications-COIL clip): Lesion quadrant: UPPER INNER RIGHT breast Using sterile technique and 1% Lidocaine with and without epinephrine as local anesthetic, under stereotactic guidance, a 9 gauge vacuum assisted device was used to perform core needle biopsy of the posterior extent of the 5.5 cm group of UPPER INNER RIGHT breast calcifications using a SUPERIOR approach. Specimen radiograph was performed showing calcifications. Specimens with calcifications are identified for pathology. At the conclusion of the procedure, a COIL shaped tissue marker clip was deployed into the biopsy cavity. Follow-up 2-view mammogram was performed and dictated separately. RIGHT BREAST STEREOTACTIC CORE NEEDLE BIOPSY #2 (anterior extent of 5.5 cm group of UPPER INNER RIGHT breast calcifications-RIBBON clip): Lesion quadrant: UPPER INNER RIGHT breast Using sterile technique and 1% Lidocaine with and without epinephrine as local anesthetic, under stereotactic guidance, a 9 gauge vacuum assisted device was used to perform core needle biopsy of the anterior extent of the 5.5 cm group of UPPER INNER RIGHT breast calcifications using a SUPERIOR approach. Specimen radiograph was performed showing calcifications. Specimens with calcifications are identified for pathology. At the conclusion of the procedure, a RIBBON shaped tissue marker clip was deployed into the biopsy cavity. Follow-up 2-view mammogram was performed and dictated separately. IMPRESSION: Stereotactic-guided biopsies of posterior extent (COIL clip) and anterior extent (RIBBON clip) of 5.5 cm group of UPPER INNER RIGHT breast calcifications. No apparent  complications. Electronically Signed: By: Margarette Canada M.D. On: 10/02/2021 13:59      IMPRESSION/PLAN: 1. 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 Dr. Lisbeth Renshaw discusses the pathology findings and reviews the nature of right breast disease. The consensus from the breast conference includes MRI for extent of disease in both the right and left breast given the [facility] findings on the left, followed by breast conservation with lumpectomy. Dr. Lisbeth Renshaw discusses the rationale for external radiotherapy to the right breast  to reduce risks of local recurrence followed by antiestrogen therapy. We discussed the risks, benefits, short, and long term effects of radiotherapy, as well as the curative intent, and the patient is interested in proceeding. Dr. Lisbeth Renshaw discusses the delivery and logistics of radiotherapy and anticipates a course of 4 weeks of radiotherapy to the right breast. We will see her back a few weeks after surgery to discuss the simulation process and anticipate we starting radiotherapy about 4-6 weeks after surgery.  2. Possible genetic predisposition to malignancy. The patient is a candidate for genetic testing given her personal and family history. She will meet genetics  today.  In a visit lasting 60 minutes, greater than 50% of the time was spent face to face reviewing her case, as well as in preparation of, discussing, and coordinating the patient's care.  The above documentation reflects my direct findings during this shared patient visit. Please see the separate note by Dr. Lisbeth Renshaw on this date for the remainder of the patient's plan of care.    Carola Rhine, [facility]    **Disclaimer: This note was dictated with voice recognition software. Similar sounding words can inadvertently be transcribed and this note may contain transcription errors which may not have been corrected upon publication of note.**

## 2021-10-10 NOTE — Progress Notes (Signed)
?[place] ?CONSULT NOTE ? ?Patient Care Team: ?Lyman Bishop, DO as PCP - General (Family Medicine) ?Rockwell Germany, RN as Oncology Nurse Navigator ?Mauro Kaufmann, RN as Oncology Nurse Navigator ?Coralie Keens, MD as Consulting Physician (General Surgery) ?Nicholas Lose, MD as Consulting Physician (Hematology and Oncology) ?Kyung Rudd, MD as Consulting Physician (Radiation Oncology) ? ?CHIEF COMPLAINTS/PURPOSE OF CONSULTATION:  ?Newly diagnosed breast cancer ? ?HISTORY OF PRESENTING ILLNESS:  ?Kathy Dickerson 66 y.o. female is here because of recent diagnosis of DCIS the right breast. Screening mammogram on 09/07/2021 showed right breast with possible calcifications and asymmetry. Diagnostic mammogram and [place] on 09/21/2021 showed 5.5 cm linear and branching group of calcifications in the upper inner right breast highly suspicious for malignancy, and 2 groups of suspicious left breast calcifications. Right breast biopsy on 10/02/2021 showed high grade DCIS with necrosis and calcifications, ER/PR-. Left breast biopsy on 10/04/2021 showed focal atypical lobular hyperplasia with calcifications. She presents to the clinic today for initial evaluation and discussion of treatment options.  ? ?I reviewed her records extensively and collaborated the history with the patient. ? ?SUMMARY OF ONCOLOGIC HISTORY: ?Oncology History  ?Ductal carcinoma in situ (DCIS) of right breast  ?10/05/2021 Initial Diagnosis  ? Ductal carcinoma in situ (DCIS) of right breast ?  ?10/11/2021 Cancer Staging  ? Staging form: Breast, AJCC 8th Edition ?- Clinical stage from 10/11/2021: Stage 0 (cTis (DCIS), cN0, cM0, ER-, PR-, HER2: Not Assessed) - Signed by Nicholas Lose, MD on 10/11/2021 ?Stage prefix: Initial diagnosis ?Nuclear grade: G3 ?Laterality: Right ?Staged by: Pathologist and managing physician ?Stage used in treatment planning: Yes ?National guidelines used in treatment planning: Yes ? ?  ? ? ?MEDICAL HISTORY:   ?Past Medical History:  ?Diagnosis Date  ? GERD (gastroesophageal reflux disease)   ? Hypertension   ? ? ?SURGICAL HISTORY: ?Past Surgical History:  ?Procedure Laterality Date  ? CHOLECYSTECTOMY    ? removal of cyst on buttocks    ? ? ?SOCIAL HISTORY: ?Social History  ? ?Socioeconomic History  ? Marital status: Married  ?  Spouse name: Not on file  ? Number of children: Not on file  ? Years of education: Not on file  ? Highest education level: Not on file  ?Occupational History  ? Not on file  ?Tobacco Use  ? Smoking status: Never  ? Smokeless tobacco: Not on file  ?Substance and Sexual Activity  ? Alcohol use: No  ? Drug use: No  ? Sexual activity: Yes  ?  Birth control/protection: None  ?Other Topics Concern  ? Not on file  ?Social History Narrative  ? ** Merged History Encounter **  ?    ? ?Social Determinants of Health  ? ?Financial Resource Strain: Not on file  ?Food Insecurity: Not on file  ?Transportation Needs: Not on file  ?Physical Activity: Not on file  ?Stress: Not on file  ?Social Connections: Not on file  ?Intimate Partner Violence: Not on file  ? ? ?FAMILY HISTORY: ?Family History  ?Problem Relation Age of Onset  ? Hypertension Mother   ? COPD Mother   ? Heart disease Mother   ? Lung cancer Father   ? Lung cancer Brother   ? Lung cancer Brother   ? Prostate cancer Brother   ? ? ?ALLERGIES:  is allergic to erythromycin. ? ?MEDICATIONS:  ?Current Outpatient Medications  ?Medication Sig Dispense Refill  ? Fexofenadine HCl (MUCINEX ALLERGY PO) Take 1 Dose by mouth as needed. Liquid mucinex cold  and flu as needed    ? fluticasone (FLONASE) 50 MCG/ACT nasal spray SMARTSIG:2 Spray(s) Both Nares Daily PRN    ? loratadine (CLARITIN) 10 MG tablet Take 10 mg by mouth daily.    ? losartan-hydrochlorothiazide (HYZAAR) 100-25 MG tablet Take 1 tablet by mouth daily.    ? Multiple Vitamins-Minerals (MULTIVITAMIN WITH IRON-MINERALS) liquid Take by mouth daily.    ? Omega-3 Fatty Acids (FISH OIL) 1000 MG CAPS 1  capsule    ? Pantoprazole Sodium (PROTONIX PO) Take 15 mg by mouth 2 (two) times a week. Every 3rd day    ? ?No current facility-administered medications for this visit.  ? ? ?REVIEW OF SYSTEMS:   ?Constitutional: Denies fevers, chills or abnormal night sweats ?All other systems were reviewed with the patient and are negative. ? ?PHYSICAL EXAMINATION: ?ECOG PERFORMANCE STATUS: 1 - Symptomatic but completely ambulatory ? ?Vitals:  ? 10/11/21 0907  ?BP: (!) 187/87  ?Pulse: (!) 114  ?Resp: 18  ?Temp: 98.1 ?F (36.7 ?C)  ?SpO2: 99%  ? ?Filed Weights  ? 10/11/21 0907  ?Weight: 170 lb (77.1 kg)  ? ?  ? ?LABORATORY DATA:  ?I have reviewed the data as listed ?Lab Results  ?Component Value Date  ? WBC 6.9 10/11/2021  ? HGB 12.5 10/11/2021  ? HCT 37.4 10/11/2021  ? MCV 93.7 10/11/2021  ? PLT 251 10/11/2021  ? ?Lab Results  ?Component Value Date  ? NA 141 10/11/2021  ? K 3.4 (L) 10/11/2021  ? CL 105 10/11/2021  ? CO2 27 10/11/2021  ? ? ?RADIOGRAPHIC STUDIES: ?I have personally reviewed the radiological reports and agreed with the findings in the report. ? ?ASSESSMENT AND PLAN:  ?Ductal carcinoma in situ (DCIS) of right breast ?10/04/2021: Screening mammogram detected calcifications in bilateral breasts.  Right breast linear calcifications 5.5 cm UIQ: 2 biopsies performed revealed high-grade DCIS with necrosis and calcifications ER 0%, PR 0%, posterior biopsy showed ER 5% ?Left breast: Biopsy revealed ALH ? ?Pathology review: I discussed with the patient the difference between DCIS and invasive breast cancer. It is considered a precancerous lesion. DCIS is classified as a 0. It is generally detected through mammograms as calcifications. We discussed the significance of grades and its impact on prognosis. We also discussed the importance of ER and PR receptors and their implications to adjuvant treatment options. Prognosis of DCIS dependence on grade, comedo necrosis. It is anticipated that if not treated, 20-30% of DCIS can  develop into invasive breast cancer. ? ?Recommendation: ?1. Breast conserving surgery ?2. Followed by adjuvant radiation therapy ? ?ALH recommendation: Risk reduction with antiestrogen therapy with tamoxifen ?5 years ?Her lifetime risk of breast cancer is at 25% ? ?Tamoxifen counseling: We discussed the risks and benefits of tamoxifen. These include but not limited to insomnia, hot flashes, mood changes, vaginal dryness, and weight gain. Although rare, serious side effects including endometrial cancer, risk of blood clots were also discussed. We strongly believe that the benefits far outweigh the risks. Patient understands these risks and consented to starting treatment. Planned treatment duration is 5 years. ? ?Return to clinic after surgery to discuss the final pathology report and come up with an adjuvant treatment plan. ? ? ? ? ?All questions were answered. The patient knows to call the clinic with any problems, questions or concerns. ?  ?Rulon Eisenmenger, MD, MPH ?10/11/2021  ? ? I, Thana Ates, am acting as scribe for Nicholas Lose, MD. ? ?I have reviewed the above documentation for accuracy and  completeness, and I agree with the above. ? ? ? ? ?

## 2021-10-11 ENCOUNTER — Inpatient Hospital Stay: Payer: Medicare Other | Attending: Hematology and Oncology

## 2021-10-11 ENCOUNTER — Encounter: Payer: Self-pay | Admitting: *Deleted

## 2021-10-11 ENCOUNTER — Ambulatory Visit
Admission: RE | Admit: 2021-10-11 | Discharge: 2021-10-11 | Disposition: A | Discharge status: Home / Self Care | Payer: Medicare Other | Source: Ambulatory Visit | Attending: Radiation Oncology | Admitting: Radiation Oncology

## 2021-10-11 ENCOUNTER — Inpatient Hospital Stay: Payer: Medicare Other | Admitting: Licensed Clinical Social Worker

## 2021-10-11 ENCOUNTER — Inpatient Hospital Stay (HOSPITAL_BASED_OUTPATIENT_CLINIC_OR_DEPARTMENT_OTHER): Payer: Medicare Other | Admitting: Genetic Counselor

## 2021-10-11 ENCOUNTER — Other Ambulatory Visit: Payer: Self-pay | Admitting: *Deleted

## 2021-10-11 ENCOUNTER — Other Ambulatory Visit: Payer: Self-pay

## 2021-10-11 ENCOUNTER — Inpatient Hospital Stay (HOSPITAL_BASED_OUTPATIENT_CLINIC_OR_DEPARTMENT_OTHER): Payer: Medicare Other | Admitting: Hematology and Oncology

## 2021-10-11 ENCOUNTER — Encounter: Payer: Self-pay | Admitting: Radiation Oncology

## 2021-10-11 ENCOUNTER — Ambulatory Visit: Payer: Medicare Other | Admitting: Physical Therapy

## 2021-10-11 DIAGNOSIS — D0511 Intraductal carcinoma in situ of right breast: Secondary | ICD-10-CM

## 2021-10-11 DIAGNOSIS — Z801 Family history of malignant neoplasm of trachea, bronchus and lung: Secondary | ICD-10-CM | POA: Insufficient documentation

## 2021-10-11 DIAGNOSIS — Z800 Family history of malignant neoplasm of digestive organs: Secondary | ICD-10-CM | POA: Diagnosis not present

## 2021-10-11 DIAGNOSIS — Z808 Family history of malignant neoplasm of other organs or systems: Secondary | ICD-10-CM

## 2021-10-11 DIAGNOSIS — I10 Essential (primary) hypertension: Secondary | ICD-10-CM | POA: Insufficient documentation

## 2021-10-11 DIAGNOSIS — Z803 Family history of malignant neoplasm of breast: Secondary | ICD-10-CM

## 2021-10-11 LAB — CMP (CANCER CENTER ONLY)
ALT: 15 U/L (ref 0–44)
AST: 17 U/L (ref 15–41)
Albumin: 4.4 g/dL (ref 3.5–5.0)
Alkaline Phosphatase: 58 U/L (ref 38–126)
Anion gap: 9 (ref 5–15)
BUN: 19 mg/dL (ref 8–23)
CO2: 27 mmol/L (ref 22–32)
Calcium: 10.5 mg/dL — ABNORMAL HIGH (ref 8.9–10.3)
Chloride: 105 mmol/L (ref 98–111)
Creatinine: 0.79 mg/dL (ref 0.44–1.00)
GFR, Estimated: >60 mL/min (ref >60)
Glucose, Bld: 115 mg/dL — ABNORMAL HIGH (ref 70–99)
Potassium: 3.4 mmol/L — ABNORMAL LOW (ref 3.5–5.1)
Sodium: 141 mmol/L (ref 135–145)
Total Bilirubin: 0.8 mg/dL (ref 0.3–1.2)
Total Protein: 7.6 g/dL (ref 6.5–8.1)

## 2021-10-11 LAB — CBC WITH DIFFERENTIAL (CANCER CENTER ONLY)
Abs Immature Granulocytes: 0.02 10*3/uL (ref 0.00–0.07)
Basophils Absolute: 0 10*3/uL (ref 0.0–0.1)
Basophils Relative: 1 %
Eosinophils Absolute: 0 10*3/uL (ref 0.0–0.5)
Eosinophils Relative: 0 %
HCT: 37.4 % (ref 36.0–46.0)
Hemoglobin: 12.5 g/dL (ref 12.0–15.0)
Immature Granulocytes: 0 %
Lymphocytes Relative: 20 %
Lymphs Abs: 1.4 10*3/uL (ref 0.7–4.0)
MCH: 31.3 pg (ref 26.0–34.0)
MCHC: 33.4 g/dL (ref 30.0–36.0)
MCV: 93.7 fL (ref 80.0–100.0)
Monocytes Absolute: 0.5 10*3/uL (ref 0.1–1.0)
Monocytes Relative: 8 %
Neutro Abs: 4.9 10*3/uL (ref 1.7–7.7)
Neutrophils Relative %: 71 %
Platelet Count: 251 10*3/uL (ref 150–400)
RBC: 3.99 MIL/uL (ref 3.87–5.11)
RDW: 14.2 % (ref 11.5–15.5)
WBC Count: 6.9 10*3/uL (ref 4.0–10.5)
nRBC: 0 % (ref 0.0–0.2)

## 2021-10-11 LAB — GENETIC SCREENING ORDER

## 2021-10-11 NOTE — Progress Notes (Signed)
[place] Clinical Social Work  ?Initial Assessment ? ? ?Kathy Dickerson "Kathy Dickerson" is a 66 y.o. year old female accompanied by spouse, Kathy Dickerson. Clinical Social Work was referred by  [facility]  for assessment of psychosocial needs.  ? ?SDOH (Social Determinants of Health) assessments performed: Yes ?SDOH Interventions   ? ?Flowsheet Row Most Recent Value  ?SDOH Interventions   ?Food Insecurity Interventions Intervention Not Indicated  ?Housing Interventions Intervention Not Indicated  ?Transportation Interventions Intervention Not Indicated  ? ?  ?  ?Distress Screen completed: Yes ?ONCBCN DISTRESS SCREENING 10/11/2021  ?Distress experienced in past week (1-10) 1  ? ? ? ? ?Family/Social Information:  ?Housing Arrangement: patient lives with husband Kathy Dickerson) and adult son Kathy Dickerson) . They have 2 cats. Other son Kathy Dickerson) lives in [place] ?Family members/support persons in your life? Family and Friends/neighbors ?Transportation concerns: no  ?Employment: Retired. Income source: retirement savings ?Financial concerns: No ?Type of concern: None ?Food access concerns: no ?Religious or spiritual practice: yes ?Services Currently in place:  n/a ? ?Coping/ Adjustment to diagnosis: ?Patient understands treatment plan and what happens next? yes, waiting on an MRI to determine exact surgical plan ?Concerns about diagnosis and/or treatment:  not especially distressed- taking the information as it comes ?Patient reported stressors:  general adjustment to unexpected diagnosis ?Hopes and priorities: to be treated and continue her enjoyable activities ?Patient enjoys  walking, tennis, cooking, movies/tv, their two cats ?Current coping skills/ strengths: Capable of independent living , Communication skills , Special hobby/interest , and Supportive family/friends  ? ? ? SUMMARY: ?Current SDOH Barriers:  ?No significant SDOH concerns at this time ? ?Clinical Social Work Clinical Goal(s):  ?None at this time ? ?Interventions: ?Discussed common  feeling and emotions when being diagnosed with cancer, and the importance of support during treatment ?Informed patient of the support team roles and support services at [facility] ?Provided CSW contact information and encouraged patient to call with any questions or concerns ?CSW will refer patient for an Alight guide once patient has surgical plan ? ? ?Follow Up Plan: Patient will contact CSW when she has a definite surgical plan so CSW can refer for peer mentor/Alight guide ?Patient verbalizes understanding of plan: Yes ? ? ? ?Derald Lorge E Isola Mehlman, LCSW ?

## 2021-10-11 NOTE — Research (Signed)
MTG-015 - Tissue and Bodily Fluids: Translational Medicine: Discovery and Evaluation of Biomarkers/Pharmacogenomics for the Diagnosis and Personalized Management of Patients   ?Patient Kathy Dickerson was identified by Dr. Lindi Adie as a potential candidate for the above listed study.  This Clinical Research Nurse met with [name], [id number], on 10/11/21 in a manner and location that ensures patient privacy to discuss participation in the above listed research study.  Patient is Accompanied by her husband .  A copy of the informed consent document and separate HIPAA Authorization was provided to the patient.  Patient reads, speaks, and understands [place].   ?Patient was provided with the business card of this Nurse and encouraged to contact the research team with any questions.  Approximately 20 minutes were spent with the patient reviewing the informed consent documents.  Patient was provided the option of taking informed consent documents home to review and was encouraged to review at their convenience with their support network, including other care providers. Patient took the consent documents home to review.  The pt was told that if she is interested in the study, then she would need to sign the consent form and have her blood drawn prior to receiving surgery and beginning any treatment.  The pt verbalized understanding.  The pt agreed to read the consent form over the weekend, and the nurse will contact the pt on Monday, 10/16/21 to discuss the study and her participation. ?Brion Aliment RN, BSN, CCRP ?Clinical Research Nurse Lead ?10/11/2021 12:40 PM    ?

## 2021-10-11 NOTE — Assessment & Plan Note (Signed)
10/04/2021: Screening mammogram detected calcifications in bilateral breasts.  Right breast linear calcifications 5.5 cm UIQ: 2 biopsies performed revealed high-grade DCIS with necrosis and calcifications ER 0%, PR 0%, posterior biopsy showed ER 5% ?Left breast: Biopsy revealed ALH ? ?Pathology review: I discussed with the patient the difference between DCIS and invasive breast cancer. It is considered a precancerous lesion. DCIS is classified as a 0. It is generally detected through mammograms as calcifications. We discussed the significance of grades and its impact on prognosis. We also discussed the importance of ER and PR receptors and their implications to adjuvant treatment options. Prognosis of DCIS dependence on grade, comedo necrosis. It is anticipated that if not treated, 20-30% of DCIS can develop into invasive breast cancer. ? ?Recommendation: ?1. Breast conserving surgery ?2. Followed by adjuvant radiation therapy ? ?ALH recommendation: Risk reduction with antiestrogen therapy with tamoxifen ?5 years ?Her lifetime risk of breast cancer is at 25% ? ?Tamoxifen counseling: We discussed the risks and benefits of tamoxifen. These include but not limited to insomnia, hot flashes, mood changes, vaginal dryness, and weight gain. Although rare, serious side effects including endometrial cancer, risk of blood clots were also discussed. We strongly believe that the benefits far outweigh the risks. Patient understands these risks and consented to starting treatment. Planned treatment duration is 5 years. ? ?Return to clinic after surgery to discuss the final pathology report and come up with an adjuvant treatment plan. ? ? ? ?

## 2021-10-12 ENCOUNTER — Encounter: Payer: Self-pay | Admitting: Genetic Counselor

## 2021-10-12 DIAGNOSIS — Z803 Family history of malignant neoplasm of breast: Secondary | ICD-10-CM | POA: Insufficient documentation

## 2021-10-12 HISTORY — DX: Family history of malignant neoplasm of breast: Z80.3

## 2021-10-12 NOTE — Progress Notes (Signed)
REFERRING PROVIDER: ?Nicholas Lose, MD ?[facility] ?[place],  [place] [address] ? ?PRIMARY PROVIDER:  ?Lyman Bishop, DO ? ?PRIMARY REASON FOR VISIT:  ?1. Ductal carcinoma in situ (DCIS) of right breast   ?2. Family history of breast cancer   ? ? ?HISTORY OF PRESENT ILLNESS:   ?Kathy Dickerson, a 66 y.o. female, was seen for a [place] cancer genetics consultation during the breast multidisciplinary clinic at the request of Dr. Lindi Adie due to a personal and family history of cancer.  Kathy Dickerson presents to clinic today to discuss the possibility of a hereditary predisposition to cancer, to discuss genetic testing, and to further clarify her future cancer risks, as well as potential cancer risks for family members.  ? ?In February 2023, at the age of 30, Kathy Dickerson was diagnosed with ductal carcinoma in situ of the right breast (ER-/PR-). The treatment plan is pending.  ? ?CANCER HISTORY:  ?Oncology History  ?Ductal carcinoma in situ (DCIS) of right breast  ?10/05/2021 Initial Diagnosis  ? Ductal carcinoma in situ (DCIS) of right breast ?  ?10/11/2021 Cancer Staging  ? Staging form: Breast, AJCC 8th Edition ?- Clinical stage from 10/11/2021: Stage 0 (cTis (DCIS), cN0, cM0, ER-, PR-, HER2: Not Assessed) - Signed by Nicholas Lose, MD on 10/11/2021 ?Stage prefix: Initial diagnosis ?Nuclear grade: G3 ?Laterality: Right ?Staged by: Pathologist and managing physician ?Stage used in treatment planning: Yes ?National guidelines used in treatment planning: Yes ? ?  ? ? ?RISK FACTORS:  ?Mammogram within the last year: yes ?Colonoscopy: no; not examined. ?Up to date with pelvic exams: most recent PAP 5 years ago. ?Menarche was at age 45 or 37.  ?First live birth at age 63.  ?Menopausal status: postmenopausal.  ?OCP use for approximately  10-15  years.  ?HRT use: 0 years. ? ? ?Past Medical History:  ?Diagnosis Date  ? Family history of breast cancer 10/12/2021  ? GERD (gastroesophageal reflux disease)   ?  Hypertension   ? ? ?Past Surgical History:  ?Procedure Laterality Date  ? CHOLECYSTECTOMY    ? removal of cyst on buttocks    ? ? ? ?FAMILY HISTORY:  ?We obtained a detailed, 4-generation family history.  Significant diagnoses are listed below: ?Family History  ?Problem Relation Age of Onset  ? Cancer Father   ?     unknown primary; mets  ? Lung cancer Brother 93  ? Lung cancer Brother 83  ? Breast cancer Niece 97  ? ? ?Kathy Dickerson is unaware of previous family history of genetic testing for hereditary cancer risks. There is no reported Ashkenazi Jewish ancestry. There is no known consanguinity. ? ?GENETIC COUNSELING ASSESSMENT: Kathy Dickerson is a 67 y.o. female with a personal and family history of cancer which is somewhat suggestive of a hereditary cancer syndrome and predisposition to cancer given the presence of related cancers at young ages in the family. We, therefore, discussed and recommended the following at today's visit.  ? ?DISCUSSION: We discussed that 5 - 10% of cancer is hereditary, with most cases of hereditary breast cancer associated with mutations in BRCA1/2.  There are other genes that can be associated with hereditary breast cancer syndromes.  Type of cancer risk and level of risk are gene-specific. We discussed that testing is beneficial for several reasons including knowing how to follow individuals after completing their treatment, identifying whether potential treatment options would be beneficial, and understanding if other family members could be at risk for cancer and allowing  them to undergo genetic testing.  ? ?We reviewed the characteristics, features and inheritance patterns of hereditary cancer syndromes. We also discussed genetic testing, including the appropriate family members to test, the process of testing, insurance coverage and turn-around-time for results. We discussed the implications of a negative, positive and/or variant of uncertain significant result. In order to get  genetic test results in a timely manner so that Kathy Dickerson can use these genetic test results for surgical decisions, we recommended Kathy Dickerson pursue genetic testing for the [employer].  The BRCAplus panel offered by [employer] and includes sequencing and deletion/duplication analysis for the following 8 genes: ATM, BRCA1, BRCA2, CDH1, CHEK2, PALB2, PTEN, and TP53.  Once complete, we recommend Kathy Dickerson pursue reflex genetic testing to a more comprehensive gene panel.  ? ?Kathy Dickerson  was offered a common hereditary cancer panel (47 genes) and an expanded pan-cancer panel (77 genes). Kathy Dickerson was informed of the benefits and limitations of each panel, including that expanded pan-cancer panels contain genes that do not have clear management guidelines at this point in time.  We also discussed that as the number of genes included on a panel increases, the chances of variants of uncertain significance increases.  After considering the benefits and limitations of each gene panel, Kathy Dickerson  elected to have an expanded pan-cancer panel through [place]. ? ?The CancerNext-Expanded gene panel offered by [facility] and includes sequencing, rearrangement, and RNA analysis for the following 77 genes: AIP, ALK, APC, ATM, AXIN2, BAP1, BARD1, BLM, BMPR1A, BRCA1, BRCA2, BRIP1, CDC73, CDH1, CDK4, CDKN1B, CDKN2A, CHEK2, CTNNA1, DICER1, FANCC, FH, FLCN, GALNT12, KIF1B, LZTR1, MAX, MEN1, MET, MLH1, MSH2, MSH3, MSH6, MUTYH, NBN, NF1, NF2, NTHL1, PALB2, PHOX2B, PMS2, POT1, PRKAR1A, PTCH1, PTEN, RAD51C, RAD51D, RB1, RECQL, RET, SDHA, SDHAF2, SDHB, SDHC, SDHD, SMAD4, SMARCA4, SMARCB1, SMARCE1, STK11, SUFU, TMEM127, TP53, TSC1, TSC2, VHL and XRCC2 (sequencing and deletion/duplication); EGFR, EGLN1, HOXB13, KIT, MITF, PDGFRA, POLD1, and POLE (sequencing only); EPCAM and GREM1 (deletion/duplication only).  ? ?Based on Kathy Dickerson's personal and family history of cancer, she meets medical criteria for genetic  testing. Despite that she meets criteria, she may still have an out of pocket cost.  ? ?PLAN: After considering the risks, benefits, and limitations, Kathy Dickerson provided informed consent to pursue genetic testing and the blood sample was sent to [facility] for analysis of the [place] and CancerNext-Expanded +RNAinsight Panels. Results should be available within approximately 1-2 weeks' time, at which point they will be disclosed by telephone to Kathy Dickerson, as will any additional recommendations warranted by these results. Kathy Dickerson will receive a summary of her genetic counseling visit and a copy of her results once available. This information will also be available in Epic.  ? ?Lastly, we encouraged Kathy Dickerson to remain in contact with cancer genetics annually so that we can continuously update the family history and inform her of any changes in cancer genetics and testing that may be of benefit for this family.  ? ?Kathy Dickerson's questions were answered to her satisfaction today. Our contact information was provided should additional questions or concerns arise. Thank you for the referral and allowing [place] to share in the care of your patient.  ? ?Sye Schroepfer M. Joette Catching, [place], [place] ?Genetic Counselor ?[email or web address] ?(P) [phone] ? ?The patient was seen for a total of 30 minutes in face-to-face genetic counseling.  Patient was accompanied by her husband, Remo Lipps.  Drs. Magrinat, Lindi Adie and/or Burr Medico were available to discuss this case as  needed.  ?_______________________________________________________________________ ?For Office Staff:  ?Number of people involved in session: 2 ?Was an Psychologist, prison and probation services involved with case: yes; [employer] first year student, Maddy, observed this session  ? ?

## 2021-10-16 ENCOUNTER — Telehealth: Payer: Self-pay | Admitting: *Deleted

## 2021-10-16 ENCOUNTER — Encounter: Payer: Medicare Other | Admitting: *Deleted

## 2021-10-16 NOTE — Telephone Encounter (Signed)
MTG-015 - Tissue and Bodily Fluids: Translational Medicine: Discovery and Evaluation of Biomarkers/Pharmacogenomics for the Diagnosis and Personalized Management of Patients  ? ? ?This Nurse has reviewed this patient's inclusion and exclusion criteria as a second review and confirms Kathy Dickerson is eligible for study participation.  Patient may continue with enrollment. ? ?Marjie Skiff. Elyan Vanwieren, RN, BSN, CHPN ?She  [facility]  [place] ?Clinical Research Nurse ?[place] ?Direct Dial [phone]  Pager [phone] ?10/16/2021 3:24 PM ?

## 2021-10-16 NOTE — Telephone Encounter (Signed)
MTG-015 - Tissue and Bodily Fluids: Translational Medicine: Discovery and Evaluation of Biomarkers/Pharmacogenomics for the Diagnosis and Personalized Management of Patients   ? ? Research nurse called this pt to answer any questions about the study.  The pt said that she did read the consent form, and she had some questions.  The nurse answered all of her questions. The nurse explained that the consent form covers all of the [facility] [facility] studies, and it is a broad consent.  The pt was told that the study she was approached for only involves a blood collection, and no tissue submission is required. The pt was informed that there is a follow up call around 12 months.  The pt said she wanted to participate in the study. The pt agreed to meet with the research nurse tomorrow at 11am to sign the consent form, and to have her blood drawn at 11:30am.  The pt meets all of the inclusion/exclusion criteria.  Larina Bras, research nurse, completed the 2nd RN review and confirmed the pt meets all inclusion/exclusion criteria for enrollment.   ?Brion Aliment RN, BSN, CCRP ?Clinical Research Nurse Lead ?10/16/2021 2:44 PM   ?

## 2021-10-17 ENCOUNTER — Telehealth: Payer: Self-pay | Admitting: *Deleted

## 2021-10-17 ENCOUNTER — Encounter: Payer: Self-pay | Admitting: *Deleted

## 2021-10-17 ENCOUNTER — Inpatient Hospital Stay: Payer: Medicare Other | Admitting: *Deleted

## 2021-10-17 ENCOUNTER — Inpatient Hospital Stay: Payer: Medicare Other

## 2021-10-17 NOTE — Telephone Encounter (Signed)
Spoke with patient to follow up from [facility] 3/1 and assess navigation needs. Patient had some questions regarding her lab results and so I have reviewed them with her. Patient denies an other questions at this time. Encouraged her to call should anything arise.  Patient verbalized understanding.  ?

## 2021-10-17 NOTE — Progress Notes (Signed)
[employer] 2021-05 - Specimen Collection Study to Evaluate Biomarkers in Subjects with Cancer   ? ?Donell Sievert, research nurse, was notified that the pt wanted to cancel her research consent appt scheduled at 11 am today and lab appt.  The nurse was told that the pt's family did not want her to participate in the study.  Appointments canceled per patient request.  ?Brion Aliment RN, BSN, CCRP ?Clinical Research Nurse Lead ?10/17/2021 11:29 AM   ?

## 2021-10-18 ENCOUNTER — Ambulatory Visit
Admission: RE | Admit: 2021-10-18 | Discharge: 2021-10-18 | Disposition: A | Discharge status: Home / Self Care | Payer: Medicare Other | Source: Ambulatory Visit | Attending: Surgery | Admitting: Surgery

## 2021-10-18 ENCOUNTER — Other Ambulatory Visit: Payer: Self-pay

## 2021-10-18 DIAGNOSIS — D0511 Intraductal carcinoma in situ of right breast: Secondary | ICD-10-CM

## 2021-10-18 MED ORDER — GADOBUTROL 1 MMOL/ML IV SOLN
8.0000 mL | Freq: Once | INTRAVENOUS | Status: AC | PRN
  Administered 2021-10-18: 8 mL via INTRAVENOUS

## 2021-10-19 ENCOUNTER — Encounter: Payer: Self-pay | Admitting: *Deleted

## 2021-10-19 ENCOUNTER — Other Ambulatory Visit: Payer: Self-pay | Admitting: *Deleted

## 2021-10-19 DIAGNOSIS — R928 Other abnormal and inconclusive findings on diagnostic imaging of breast: Secondary | ICD-10-CM

## 2021-10-19 DIAGNOSIS — D0511 Intraductal carcinoma in situ of right breast: Secondary | ICD-10-CM

## 2021-10-20 ENCOUNTER — Other Ambulatory Visit: Payer: Self-pay | Admitting: Surgery

## 2021-10-20 ENCOUNTER — Encounter: Payer: Self-pay | Admitting: Genetic Counselor

## 2021-10-20 ENCOUNTER — Ambulatory Visit
Admission: RE | Admit: 2021-10-20 | Discharge: 2021-10-20 | Disposition: A | Discharge status: Home / Self Care | Payer: Medicare Other | Source: Ambulatory Visit | Attending: Surgery | Admitting: Surgery

## 2021-10-20 ENCOUNTER — Telehealth: Payer: Self-pay | Admitting: Genetic Counselor

## 2021-10-20 DIAGNOSIS — R928 Other abnormal and inconclusive findings on diagnostic imaging of breast: Secondary | ICD-10-CM

## 2021-10-20 DIAGNOSIS — Z1379 Encounter for other screening for genetic and chromosomal anomalies: Secondary | ICD-10-CM | POA: Insufficient documentation

## 2021-10-20 DIAGNOSIS — R9389 Abnormal findings on diagnostic imaging of other specified body structures: Secondary | ICD-10-CM

## 2021-10-20 DIAGNOSIS — R921 Mammographic calcification found on diagnostic imaging of breast: Secondary | ICD-10-CM

## 2021-10-20 DIAGNOSIS — D0511 Intraductal carcinoma in situ of right breast: Secondary | ICD-10-CM

## 2021-10-20 NOTE — Telephone Encounter (Signed)
Revealed negative BRCAPlus STAT results.  ? ? ?

## 2021-10-24 ENCOUNTER — Telehealth: Payer: Self-pay | Admitting: Genetic Counselor

## 2021-10-24 ENCOUNTER — Ambulatory Visit: Payer: Self-pay | Admitting: Genetic Counselor

## 2021-10-24 DIAGNOSIS — Z803 Family history of malignant neoplasm of breast: Secondary | ICD-10-CM

## 2021-10-24 DIAGNOSIS — Z1379 Encounter for other screening for genetic and chromosomal anomalies: Secondary | ICD-10-CM

## 2021-10-24 DIAGNOSIS — D0511 Intraductal carcinoma in situ of right breast: Secondary | ICD-10-CM

## 2021-10-24 NOTE — Progress Notes (Signed)
HPI:   ?Ms. Kreiser was previously seen in the [place] clinic due to a personal and family history of cancer and concerns regarding a hereditary predisposition to cancer. Please refer to our prior cancer genetics clinic note for more information regarding our discussion, assessment and recommendations, at the time. Ms. Nishida recent genetic test results were disclosed to her, as were recommendations warranted by these results. These results and recommendations are discussed in more detail below. ? ?CANCER HISTORY:  ?Oncology History  ?Ductal carcinoma in situ (DCIS) of right breast  ?10/05/2021 Initial Diagnosis  ? Ductal carcinoma in situ (DCIS) of right breast ?  ?10/11/2021 Cancer Staging  ? Staging form: Breast, AJCC 8th Edition ?- Clinical stage from 10/11/2021: Stage 0 (cTis (DCIS), cN0, cM0, ER-, PR-, HER2: Not Assessed) - Signed by Nicholas Lose, MD on 10/11/2021 ?Stage prefix: Initial diagnosis ?Nuclear grade: G3 ?Laterality: Right ?Staged by: Pathologist and managing physician ?Stage used in treatment planning: Yes ?National guidelines used in treatment planning: Yes ? ?  ?10/20/2021 Genetic Testing  ? Negative hereditary cancer genetic testing: no pathogenic variants detected in [facility] BRCAPlus Panel and CancerNext-Expanded +RNAinsight Panel.  Report dates are October 19, 2021 and October 22, 2021.  ? ?The BRCAplus panel offered by [employer] and includes sequencing and deletion/duplication analysis for the following 8 genes: ATM, BRCA1, BRCA2, CDH1, CHEK2, PALB2, PTEN, and TP53.  The CancerNext-Expanded gene panel offered by [facility] and includes sequencing, rearrangement, and RNA analysis for the following 77 genes: AIP, ALK, APC, ATM, AXIN2, BAP1, BARD1, BLM, BMPR1A, BRCA1, BRCA2, BRIP1, CDC73, CDH1, CDK4, CDKN1B, CDKN2A, CHEK2, CTNNA1, DICER1, FANCC, FH, FLCN, GALNT12, KIF1B, LZTR1, MAX, MEN1, MET, MLH1, MSH2, MSH3, MSH6, MUTYH, NBN, NF1, NF2, NTHL1, PALB2, PHOX2B, PMS2, POT1,  PRKAR1A, PTCH1, PTEN, RAD51C, RAD51D, RB1, RECQL, RET, SDHA, SDHAF2, SDHB, SDHC, SDHD, SMAD4, SMARCA4, SMARCB1, SMARCE1, STK11, SUFU, TMEM127, TP53, TSC1, TSC2, VHL and XRCC2 (sequencing and deletion/duplication); EGFR, EGLN1, HOXB13, KIT, MITF, PDGFRA, POLD1, and POLE (sequencing only); EPCAM and GREM1 (deletion/duplication only).  ?  ? ? ?  ?FAMILY HISTORY:  ?We obtained a detailed, 4-generation family history.  Significant diagnoses are listed below: ?     ?Family History  ?Problem Relation Age of Onset  ? Cancer Father    ?      unknown primary; mets  ? Lung cancer Brother 8  ? Lung cancer Brother 67  ? Breast cancer Niece 69  ?  ? ? ?Ms. Youngers is unaware of previous family history of genetic testing for hereditary cancer risks. There is no reported Ashkenazi Jewish ancestry. There is no known consanguinity. ? ?GENETIC TEST RESULTS:  ?The [facility] CancerNext-Expanded +RNAinsight Panel found no pathogenic mutations. The CancerNext-Expanded gene panel offered by [facility] and includes sequencing, rearrangement, and RNA analysis for the following 77 genes: AIP, ALK, APC, ATM, AXIN2, BAP1, BARD1, BLM, BMPR1A, BRCA1, BRCA2, BRIP1, CDC73, CDH1, CDK4, CDKN1B, CDKN2A, CHEK2, CTNNA1, DICER1, FANCC, FH, FLCN, GALNT12, KIF1B, LZTR1, MAX, MEN1, MET, MLH1, MSH2, MSH3, MSH6, MUTYH, NBN, NF1, NF2, NTHL1, PALB2, PHOX2B, PMS2, POT1, PRKAR1A, PTCH1, PTEN, RAD51C, RAD51D, RB1, RECQL, RET, SDHA, SDHAF2, SDHB, SDHC, SDHD, SMAD4, SMARCA4, SMARCB1, SMARCE1, STK11, SUFU, TMEM127, TP53, TSC1, TSC2, VHL and XRCC2 (sequencing and deletion/duplication); EGFR, EGLN1, HOXB13, KIT, MITF, PDGFRA, POLD1, and POLE (sequencing only); EPCAM and GREM1 (deletion/duplication only).  ? ?The test report has been scanned into EPIC and is located under the Molecular Pathology section of the Results Review tab.  A portion of the result  report is included below for reference. Genetic testing reported out on October 23, 2021.  ? ? ? ? ?Even though a  pathogenic variant was not identified, possible explanations for the cancer in the family may include: ?There may be no hereditary risk for cancer in the family. The cancers in Ms. Camberos and/or her family may be sporadic/familial or due to other genetic and environmental factors. ?There may be a gene mutation in one of these genes that current testing methods cannot detect but that chance is small. ?There could be another gene that has not yet been discovered, or that we have not yet tested, that is responsible for the cancer diagnoses in the family.  ?It is also possible there is a hereditary cause for the cancer in the family that Ms. Eckstrom did not inherit. ? ?Therefore, it is important to remain in touch with cancer genetics in the future so that we can continue to offer Ms. Carico the most up to date genetic testing.  ? ?ADDITIONAL GENETIC TESTING:  ?We discussed with Ms. Leathers that her genetic testing was fairly extensive.  If there are genes identified to increase cancer risk that can be analyzed in the future, we would be happy to discuss and coordinate this testing at that time.   ? ?CANCER SCREENING RECOMMENDATIONS:  ?Ms. Faron's test result is considered negative (normal).  This means that we have not identified a hereditary cause for her personal history of cancer at this time. ? ?An individual's cancer risk and medical management are not determined by genetic test results alone. Overall cancer risk assessment incorporates additional factors, including personal medical history, family history, and any available genetic information that may result in a personalized plan for cancer prevention and surveillance. Therefore, it is recommended she continue to follow the cancer management and screening guidelines provided by her oncology and primary healthcare provider. ? ?RECOMMENDATIONS FOR FAMILY MEMBERS:   ?Since she did not inherit a identifiable mutation in a cancer predisposition gene  included on this panel, her children could not have inherited a known mutation from her in one of these genes. ?Individuals in this family might be at some increased risk of developing cancer, over the general population risk, due to the family history of cancer.  Individuals in the family should notify their providers of the family history of cancer. We recommend women in this family have a yearly mammogram beginning at age 48, or 65 years younger than the earliest onset of cancer, an annual clinical breast exam, and perform monthly breast self-exams.  Family members should speak with their doctors about considering prostate cancer screening.  ?Other members of the family may still carry a pathogenic variant in one of these genes that Ms. Procell did not inherit. Based on the family history, we recommend her niece, who was diagnosed with breast cancer at age 85, have genetic counseling and testing. Ms. Radke will let us know if we can be of any assistance in coordinating genetic counseling and/or testing for this family member.   ? ?FOLLOW-UP:  ?Lastly, we discussed with Ms. Zipp that cancer genetics is a rapidly advancing field and it is possible that new genetic tests will be appropriate for her and/or her family members in the future. We encouraged her to remain in contact with cancer genetics on an annual basis so we can update her personal and family histories and let her know of advances in cancer genetics that may benefit this family.  ? ?Our contact number  was provided. Ms. Colwell questions were answered to her satisfaction, and she knows she is welcome to call us at anytime with additional questions or concerns.  ? ?Guenther Dunshee M. Joette Catching, [place], [place] ?Genetic Counselor ?[email or web address] ?(P) [phone] ? ?

## 2021-10-24 NOTE — Telephone Encounter (Signed)
Revealed negative genetic testing.  Discussed that we do not know why she has breast cancer or why there is cancer in the family. It could be sporadic/famillial, due to a change in a gene that she did not inherit, due to a different gene that we are not testing, or maybe our current technology may not be able to pick something up.  It will be important for her to keep in contact with genetics to keep up with whether additional testing may be needed.     

## 2021-10-31 ENCOUNTER — Encounter: Payer: Self-pay | Admitting: *Deleted

## 2021-11-01 ENCOUNTER — Ambulatory Visit
Admission: RE | Admit: 2021-11-01 | Discharge: 2021-11-01 | Disposition: A | Discharge status: Home / Self Care | Payer: Medicare Other | Source: Ambulatory Visit | Attending: Surgery | Admitting: Surgery

## 2021-11-01 ENCOUNTER — Other Ambulatory Visit: Payer: Self-pay | Admitting: Diagnostic Radiology

## 2021-11-01 DIAGNOSIS — D0511 Intraductal carcinoma in situ of right breast: Secondary | ICD-10-CM

## 2021-11-01 DIAGNOSIS — R921 Mammographic calcification found on diagnostic imaging of breast: Secondary | ICD-10-CM

## 2021-11-01 DIAGNOSIS — R9389 Abnormal findings on diagnostic imaging of other specified body structures: Secondary | ICD-10-CM

## 2021-11-06 ENCOUNTER — Encounter: Payer: Self-pay | Admitting: *Deleted

## 2021-11-09 ENCOUNTER — Other Ambulatory Visit: Payer: Self-pay

## 2021-11-09 ENCOUNTER — Ambulatory Visit (INDEPENDENT_AMBULATORY_CARE_PROVIDER_SITE_OTHER): Payer: Medicare Other | Admitting: Plastic Surgery

## 2021-11-09 VITALS — BP 165/85 | HR 95 | Ht 63.0 in | Wt 166.0 lb

## 2021-11-09 DIAGNOSIS — D0511 Intraductal carcinoma in situ of right breast: Secondary | ICD-10-CM | POA: Diagnosis not present

## 2021-11-09 NOTE — Progress Notes (Signed)
? ?Referring Provider ?Masneri, Adele Barthel, DO ?[facility] [facility] ?[place],  [place] [address]  ? ?CC:  ?Chief Complaint  ?Patient presents with  ? Advice Only  ?   ? ?Kathy Dickerson is an 66 y.o. female.  ?HPI: Patient presents to discuss breast reconstruction.  She has had a number of biopsies on the right side showing DCIS.  It seems to be a fairly large area.  She has been recommended to have a mastectomy on that side.  She also has a left breast biopsy showing atypical lobular hyperplasia and is expecting to have a lumpectomy on that side.  She has gotten a lot of information is having a hard time deciding how to proceed.  She like some information on breast reconstruction that would help her picture how things would go going forward.  She does not smoke and is not a diabetic.  No previous breast biopsies or procedures.  She has seen Dr. Ninfa Linden as her breast surgeon. ? ?Allergies  ?Allergen Reactions  ? Erythromycin Nausea And Vomiting  ? ? ?Outpatient Encounter Medications as of 11/09/2021  ?Medication Sig  ? Fexofenadine HCl (MUCINEX ALLERGY PO) Take 1 Dose by mouth as needed. Liquid mucinex cold and flu as needed  ? fluticasone (FLONASE) 50 MCG/ACT nasal spray SMARTSIG:2 Spray(s) Both Nares Daily PRN  ? loratadine (CLARITIN) 10 MG tablet Take 10 mg by mouth daily.  ? losartan-hydrochlorothiazide (HYZAAR) 100-25 MG tablet Take 1 tablet by mouth daily.  ? Multiple Vitamins-Minerals (MULTIVITAMIN WITH IRON-MINERALS) liquid Take by mouth daily.  ? Omega-3 Fatty Acids (FISH OIL) 1000 MG CAPS 1 capsule  ? Pantoprazole Sodium (PROTONIX PO) Take 15 mg by mouth 2 (two) times a week. Every 3rd day  ? ?No facility-administered encounter medications on file as of 11/09/2021.  ?  ? ?Past Medical History:  ?Diagnosis Date  ? Family history of breast cancer 10/12/2021  ? GERD (gastroesophageal reflux disease)   ? Hypertension   ? ? ?Past Surgical History:  ?Procedure Laterality Date  ? CHOLECYSTECTOMY    ? removal of cyst  on buttocks    ? ? ?Family History  ?Problem Relation Age of Onset  ? Hypertension Mother   ? COPD Mother   ? Heart disease Mother   ? Cancer Father   ?     unknown primary; mets  ? Lung cancer Brother 61  ? Lung cancer Brother 48  ? Breast cancer Niece 10  ? ? ?Social History  ? ?Social History Narrative  ? ** Merged History Encounter **  ?    ?  ? ?Review of Systems ?General: Denies fevers, chills, weight loss ?CV: Denies chest pain, shortness of breath, palpitations ? ?Physical Exam ? ?  10/11/2021  ?  9:07 AM 11/15/2012  ?  2:17 PM  ?Vitals with BMI  ?Height '5\' 3"'$  '5\' 3"'$   ?Weight 170 lbs 209 lbs 6 oz  ?BMI 30.12 37.2  ?Systolic 017 793  ?Diastolic 87 95  ?Pulse 114 102  ?  ?General:  No acute distress,  Alert and oriented, Non-Toxic, Normal speech and affect ?Breast: She has grade 3 ptosis.  She is reasonably symmetric.  No obvious scars.  Base width 11 cm. ? 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  After discussing all the options she seems to be leaning towards a mastectomy on the right side and I believe a lumpectomy on the  left side.  She does not look like a good nipple sparing candidate to me due to her ptosis.  We discussed a number of options including autologous reconstruction.  She seems to favor implant-based reconstruction.  I did explain to her that my recommendation would be for immediate placement of tissue expander at the time of mastectomy.  This would be gradually inflated until she reached her desired size.  Subsequently that would be switched out for gel implant and I could do a reduction on the left side as well to help with symmetry.  We discussed risks include bleeding, infection, damage to surrounding structures need for additional procedures.  We discussed the need for drains postoperatively.  All her questions were answered.  She would like to talk about this further with Dr. Ninfa Linden and do some  research on her own to clarify her decision making.  We will plan to be available if she chooses to have reconstruction. ? ?Cindra Presume ?11/09/2021, 1:55 PM  ? ? ?  ?

## 2021-11-10 ENCOUNTER — Encounter: Payer: Self-pay | Admitting: *Deleted

## 2021-11-15 ENCOUNTER — Other Ambulatory Visit: Payer: Self-pay | Admitting: Surgery

## 2021-11-16 ENCOUNTER — Telehealth: Payer: Self-pay | Admitting: Plastic Surgery

## 2021-11-16 NOTE — Telephone Encounter (Signed)
Pt is calling in wanting to know if it is possible for the providers to let her know if he is able to do just the closer after the removal of her breast and make it look as natural as possible. And she would like to see how much that will cost and to see if insurance will pay anything on it to be done.  Pt did not want to make an appointment. ?

## 2021-11-20 ENCOUNTER — Encounter: Payer: Self-pay | Admitting: *Deleted

## 2021-11-21 NOTE — Telephone Encounter (Signed)
Pt is not wanting to have any reconstruction done would like to have a flat closure.   ?

## 2021-11-24 NOTE — Telephone Encounter (Signed)
Pt is being seen Monday, 4/17 ?

## 2021-11-27 ENCOUNTER — Other Ambulatory Visit: Payer: Self-pay | Admitting: Surgery

## 2021-11-27 ENCOUNTER — Encounter: Payer: Self-pay | Admitting: Plastic Surgery

## 2021-11-27 ENCOUNTER — Ambulatory Visit (INDEPENDENT_AMBULATORY_CARE_PROVIDER_SITE_OTHER): Payer: Medicare Other | Admitting: Plastic Surgery

## 2021-11-27 VITALS — Ht 63.0 in | Wt 162.4 lb

## 2021-11-27 DIAGNOSIS — Z803 Family history of malignant neoplasm of breast: Secondary | ICD-10-CM

## 2021-11-27 DIAGNOSIS — D0511 Intraductal carcinoma in situ of right breast: Secondary | ICD-10-CM | POA: Diagnosis not present

## 2021-11-27 NOTE — Progress Notes (Signed)
? ?  Patient ID: Kathy Dickerson, female    DOB: 1956-08-03, 66 y.o.   MRN: [id number] ? ? ?Chief Complaint  ?Patient presents with  ? Breast Cancer  ? ? ?The patient is a 66 year old female here for consultation for her breasts.  She was recently diagnosed with breast cancer and has decided on bilateral mastectomies without reconstruction.  Radiation is not planned.  She has not had any chemo.  She is not diabetic.  She quit smoking 30 years ago.  Her past surgical history includes a cholecystectomy and a hiatal hernia repair.  She is 5 feet 3 inches tall weighs 162 pounds.  Her preoperative bra size looks like I have DD.  She understands she will be completely flat and we will not be saving her nipple areolas. ? ? ?Review of Systems  ?Constitutional: Negative.   ?HENT: Negative.    ?Eyes: Negative.   ?Respiratory: Negative.  Negative for chest tightness and shortness of breath.   ?Cardiovascular:  Negative for leg swelling.  ?Gastrointestinal: Negative.   ?Endocrine: Negative.   ?Genitourinary: Negative.   ?Musculoskeletal: Negative.   ?Skin: Negative.   ?Hematological: Negative.   ? ?Past Medical History:  ?Diagnosis Date  ? Family history of breast cancer 10/12/2021  ? GERD (gastroesophageal reflux disease)   ? Hypertension   ?  ?Past Surgical History:  ?Procedure Laterality Date  ? CHOLECYSTECTOMY    ? removal of cyst on buttocks    ?  ? ? ?Current Outpatient Medications:  ?  Fexofenadine HCl (MUCINEX ALLERGY PO), Take 1 Dose by mouth as needed. Liquid mucinex cold and flu as needed, Disp: , Rfl:  ?  fluticasone (FLONASE) 50 MCG/ACT nasal spray, SMARTSIG:2 Spray(s) Both Nares Daily PRN, Disp: , Rfl:  ?  loratadine (CLARITIN) 10 MG tablet, Take 10 mg by mouth daily., Disp: , Rfl:  ?  losartan-hydrochlorothiazide (HYZAAR) 100-25 MG tablet, Take 1 tablet by mouth daily., Disp: , Rfl:  ?  Multiple Vitamins-Minerals (MULTIVITAMIN WITH IRON-MINERALS) liquid, Take by mouth daily., Disp: , Rfl:  ?  Omega-3 Fatty Acids  (FISH OIL) 1000 MG CAPS, 1 capsule, Disp: , Rfl:  ?  Pantoprazole Sodium (PROTONIX PO), Take 15 mg by mouth 2 (two) times a week. Every 3rd day, Disp: , Rfl:   ? ?Objective:  ? ?There were no vitals filed for this visit. ? ?Physical Exam ?Vitals reviewed.  ?Constitutional:   ?   Appearance: Normal appearance.  ?HENT:  ?   Head: Normocephalic and atraumatic.  ?Cardiovascular:  ?   Rate and Rhythm: Normal rate.  ?   Pulses: Normal pulses.  ?Pulmonary:  ?   Effort: Pulmonary effort is normal.  ?Abdominal:  ?   General: There is no distension.  ?   Palpations: Abdomen is soft.  ?Musculoskeletal:     ?   General: No swelling or deformity.  ?Skin: ?   General: Skin is warm.  ?   Capillary Refill: Capillary refill takes less than 2 seconds.  ?   Coloration: Skin is not jaundiced.  ?   Findings: No bruising.  ?Neurological:  ?   Mental Status: She is alert and oriented to person, place, and time.  ?Psychiatric:     ?   Mood and Affect: Mood normal.     ?   Behavior: Behavior normal.     ?   Thought Content: Thought content normal.  ? ? ?Assessment & Plan:  ?Ductal carcinoma in situ (DCIS) of right breast ? ?  Family history of breast cancer ? ?The patient has options for reconstruction either autologous or implant-based but has adamantly decided on no reconstruction.  She takes care of disabled son and is married.  She would like this to be as quick and simple as possible.  We talked about the possibility of areola tattooing in the future.  I drew out the scars that she will likely have.  The surgery would take around 4 hours and she will spend the night.  She will have drains that will be in there for 1 to 2 weeks.  I will see her after the surgery approximately 10 days and then she will come for 2 more visits at least after that.  I spoke with Dr. Ninfa Linden and we discussed the plan.  The request has been placed for surgery to be with Dr. Ninfa Linden and I will be present the entire time.  She was also given a prescription to  [facility]. ? ?Pictures were obtained of the patient and placed in the chart with the patient's or guardian's permission. ? ? ?Loel Lofty Karlea Mckibbin, DO ?

## 2021-11-28 ENCOUNTER — Telehealth: Payer: Self-pay | Admitting: Hematology and Oncology

## 2021-11-28 ENCOUNTER — Encounter: Payer: Self-pay | Admitting: *Deleted

## 2021-11-28 NOTE — Telephone Encounter (Signed)
Per 4/18 in basket called and spoke to pt about appointment.  Pt confirmed appointment  ?

## 2021-11-30 ENCOUNTER — Telehealth: Payer: Self-pay | Admitting: Plastic Surgery

## 2021-11-30 NOTE — Telephone Encounter (Signed)
LVM for patient to call back regarding scheduling pre and post op appts.  ?

## 2021-12-08 NOTE — Progress Notes (Signed)
? ?  Patient ID: SHAELEE Dickerson, female    DOB: November 17, 1955, 66 y.o.   MRN: [id number] ? ?Chief Complaint  ?Patient presents with  ? Pre-op Exam  ? ? ?  ICD-10-CM   ?1. Ductal carcinoma in situ (DCIS) of right breast  D05.11   ?  ? ? ? ?History of Present Illness: ?Kathy Dickerson is a 66 y.o.  female  with a history of right-sided DCIS.  She presents for preoperative evaluation for upcoming procedure, bilateral mastectomies with aesthetic closure scheduled for 12/27/2021 with Dr. Marla Roe and Dr. Ninfa Linden. ? ?The patient has had PONV with anesthesia.  She will discuss this with anesthesiologist on day of surgery.  She has already been to [facility] for her postoperative compressive bras.  She is pleased to report that radiation will not be required postoperatively.  She quit smoking tobacco approximately 30 years ago.  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  She tells me that she cannot swallow pills and notes that she has to chew some of her medications.  She will be holding her vitamins and supplements 5 days prior to surgery.  She does not have a port and is not on any chemotherapy or hormone blocking medications.  She emphasizes that she is hopeful for symmetric closure. ? ?Summary of Previous Visit: She was last seen here in the office on 11/27/2021.  At that time, confirmed that she would like to proceed without reconstruction.  Radiation is not planned.  Preoperative bra size equals DD cup.  No nipple sparing.  She understands that she will have postoperative drains.  She was provided referral to [facility]. ? 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 ? ?PMH Significant for: Right-sided DCIS, GERD, HTN, allergic rhinitis. ? ? ?Past Medical History: ?Allergies: ?Allergies  ?Allergen Reactions  ? Erythromycin Nausea And Vomiting  ? ? ?Current Medications: ? ?Current Outpatient Medications:  ?  cephALEXin (KEFLEX) 250 MG/5ML suspension, Take 10 mLs (500 mg  total) by mouth 3 (three) times daily for 7 days., Disp: 210 mL, Rfl: 0 ?  ondansetron (ZOFRAN-ODT) 4 MG disintegrating tablet, Take 1 tablet (4 mg total) by mouth every 8 (eight) hours as needed for nausea or vomiting., Disp: 20 tablet, Rfl: 0 ?  oxyCODONE (ROXICODONE) 5 MG/5ML solution, Take 5 mLs (5 mg total) by mouth every 8 (eight) hours as needed for up to 7 days for severe pain., Disp: 105 mL, Rfl: 0 ?  Fexofenadine HCl (MUCINEX ALLERGY PO), Take 1 Dose by mouth as needed. Liquid mucinex cold and flu as needed, Disp: , Rfl:  ?  fluticasone (FLONASE) 50 MCG/ACT nasal spray, SMARTSIG:2 Spray(s) Both Nares Daily PRN, Disp: , Rfl:  ?  loratadine (CLARITIN) 10 MG tablet, Take 10 mg by mouth daily., Disp: , Rfl:  ?  losartan-hydrochlorothiazide (HYZAAR) 100-25 MG tablet, Take 1 tablet by mouth daily., Disp: , Rfl:  ?  Multiple Vitamins-Minerals (MULTIVITAMIN WITH IRON-MINERALS) liquid, Take by mouth daily., Disp: , Rfl:  ?  Omega-3 Fatty Acids (FISH OIL) 1000 MG CAPS, 1 capsule, Disp: , Rfl:  ?  Pantoprazole Sodium (PROTONIX PO), Take 15 mg by mouth 2 (two) times a week. Every 3rd day, Disp: , Rfl:  ? ?Past Medical Problems: ?Past Medical History:  ?Diagnosis Date  ? Family history of breast cancer 10/12/2021  ? GERD (gastroesophageal reflux disease)   ? Hypertension   ? ? ?Past Surgical History: ?Past Surgical History:  ?  Procedure Laterality Date  ? CHOLECYSTECTOMY    ? removal of cyst on buttocks    ? ? ?Social History: ?Social History  ? ?Socioeconomic History  ? Marital status: Married  ?  Spouse name: Not on file  ? Number of children: Not on file  ? Years of education: Not on file  ? Highest education level: Not on file  ?Occupational History  ? Not on file  ?Tobacco Use  ? Smoking status: Never  ? Smokeless tobacco: Not on file  ?Substance and Sexual Activity  ? Alcohol use: No  ? Drug use: No  ? Sexual activity: Yes  ?  Birth control/protection: None  ?Other Topics Concern  ? Not on file  ?Social History  Narrative  ? ** Merged History Encounter **  ?    ? ?Social Determinants of Health  ? ?Financial Resource Strain: Not on file  ?Food Insecurity: No Food Insecurity  ? Worried About Charity fundraiser in the Last Year: Never true  ? Ran Out of Food in the Last Year: Never true  ?Transportation Needs: No Transportation Needs  ? Lack of Transportation (Medical): No  ? Lack of Transportation (Non-Medical): No  ?Physical Activity: Not on file  ?Stress: Not on file  ?Social Connections: Not on file  ?Intimate Partner Violence: Not on file  ? ? ?Family History: ?Family History  ?Problem Relation Age of Onset  ? Hypertension Mother   ? COPD Mother   ? Heart disease Mother   ? Cancer Father   ?     unknown primary; mets  ? Lung cancer Brother 99  ? Lung cancer Brother 65  ? Breast cancer Niece 25  ? ? ?Review of Systems: ?ROS ?Denies any recent hospitalizations or traumas. ? ?Physical Exam: ?Vital Signs ?BP (!) 144/63 (Patient Position: Sitting)   Pulse 97   Ht '5\' 3"'$  (1.6 m)   Wt 162 lb 9.6 oz (73.8 kg)   SpO2 97%   BMI 28.80 kg/m?  ? ?Physical Exam ?Constitutional:   ?   General: Not in acute distress. ?   Appearance: Normal appearance. Not ill-appearing.  ?HENT:  ?   Head: Normocephalic and atraumatic.  ?Eyes:  ?   Pupils: Pupils are equal, round. ?Cardiovascular:  ?   Rate and Rhythm: Normal rate. ?   Pulses: Normal pulses.  ?Pulmonary:  ?   Effort: No respiratory distress or increased work of breathing.  Speaks in full sentences. ?Abdominal:  ?   General: Abdomen is flat. No distension.   ?Musculoskeletal: Normal range of motion. No lower extremity swelling or edema.  Scattered spider veins, but no varicosities. ?Skin: ?   General: Skin is warm and dry.  ?   Findings: No erythema or rash.  ?Neurological:  ?   Mental Status: Alert and oriented to person, place, and time.  ?Psychiatric:     ?   Mood and Affect: Mood normal.     ?   Behavior: Behavior normal.  ? ? ?Assessment/Plan: ?The patient is scheduled for  bilateral mastectomies with aesthetic closure with Dr. Marla Roe.  Risks, benefits, and alternatives of procedure discussed, questions answered and consent obtained.   ? ?Smoking Status: Non-smoker. ?Last Mammogram: Planning bilateral total mastectomy for right-sided DCIS. ? ?Caprini Score: 7; Risk Factors include: Age, BMI greater than 25, breast cancer, and length of planned surgery. Recommendation for mechanical and possibly pharmacological prophylaxis.  Will discuss with surgeon and: Lovenox if indicated.  Otherwise, encourage early ambulation.  ? ?  Pictures obtained: 11/27/2021 ? ?Post-op Rx sent to pharmacy: Zofran ODT, oxycodone solution 5 mg every 8 hours as needed, Keflex suspension. ? ?Patient was provided with the General Surgical Risk consent document and Pain Medication Agreement prior to their appointment.  They had adequate time to read through the risk consent documents and Pain Medication Agreement. We also discussed them in person together during this preop appointment. All of their questions were answered to their satisfaction.  Recommended calling if they have any further questions.  Risk consent form and Pain Medication Agreement to be scanned into patient's chart. ? ? ?Electronically signed by: Krista Blue, PA-C 12/11/2021 12:28 PM ?

## 2021-12-08 NOTE — H&P (View-Only) (Signed)
? ?  Patient ID: Kathy Dickerson, female    DOB: 12-03-1955, 66 y.o.   MRN: [id number] ? ?Chief Complaint  ?Patient presents with  ? Pre-op Exam  ? ? ?  ICD-10-CM   ?1. Ductal carcinoma in situ (DCIS) of right breast  D05.11   ?  ? ? ? ?History of Present Illness: ?Kathy Dickerson is a 66 y.o.  female  with a history of right-sided DCIS.  She presents for preoperative evaluation for upcoming procedure, bilateral mastectomies with aesthetic closure scheduled for 12/27/2021 with Dr. Marla Roe and Dr. Ninfa Linden. ? ?The patient has had PONV with anesthesia.  She will discuss this with anesthesiologist on day of surgery.  She has already been to [facility] for her postoperative compressive bras.  She is pleased to report that radiation will not be required postoperatively.  She quit smoking tobacco approximately 30 years ago.  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  She tells me that she cannot swallow pills and notes that she has to chew some of her medications.  She will be holding her vitamins and supplements 5 days prior to surgery.  She does not have a port and is not on any chemotherapy or hormone blocking medications.  She emphasizes that she is hopeful for symmetric closure. ? ?Summary of Previous Visit: She was last seen here in the office on 11/27/2021.  At that time, confirmed that she would like to proceed without reconstruction.  Radiation is not planned.  Preoperative bra size equals DD cup.  No nipple sparing.  She understands that she will have postoperative drains.  She was provided referral to [facility]. ? 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 ? ?PMH Significant for: Right-sided DCIS, GERD, HTN, allergic rhinitis. ? ? ?Past Medical History: ?Allergies: ?Allergies  ?Allergen Reactions  ? Erythromycin Nausea And Vomiting  ? ? ?Current Medications: ? ?Current Outpatient Medications:  ?  cephALEXin (KEFLEX) 250 MG/5ML suspension, Take 10 mLs (500 mg  total) by mouth 3 (three) times daily for 7 days., Disp: 210 mL, Rfl: 0 ?  ondansetron (ZOFRAN-ODT) 4 MG disintegrating tablet, Take 1 tablet (4 mg total) by mouth every 8 (eight) hours as needed for nausea or vomiting., Disp: 20 tablet, Rfl: 0 ?  oxyCODONE (ROXICODONE) 5 MG/5ML solution, Take 5 mLs (5 mg total) by mouth every 8 (eight) hours as needed for up to 7 days for severe pain., Disp: 105 mL, Rfl: 0 ?  Fexofenadine HCl (MUCINEX ALLERGY PO), Take 1 Dose by mouth as needed. Liquid mucinex cold and flu as needed, Disp: , Rfl:  ?  fluticasone (FLONASE) 50 MCG/ACT nasal spray, SMARTSIG:2 Spray(s) Both Nares Daily PRN, Disp: , Rfl:  ?  loratadine (CLARITIN) 10 MG tablet, Take 10 mg by mouth daily., Disp: , Rfl:  ?  losartan-hydrochlorothiazide (HYZAAR) 100-25 MG tablet, Take 1 tablet by mouth daily., Disp: , Rfl:  ?  Multiple Vitamins-Minerals (MULTIVITAMIN WITH IRON-MINERALS) liquid, Take by mouth daily., Disp: , Rfl:  ?  Omega-3 Fatty Acids (FISH OIL) 1000 MG CAPS, 1 capsule, Disp: , Rfl:  ?  Pantoprazole Sodium (PROTONIX PO), Take 15 mg by mouth 2 (two) times a week. Every 3rd day, Disp: , Rfl:  ? ?Past Medical Problems: ?Past Medical History:  ?Diagnosis Date  ? Family history of breast cancer 10/12/2021  ? GERD (gastroesophageal reflux disease)   ? Hypertension   ? ? ?Past Surgical History: ?Past Surgical History:  ?  Procedure Laterality Date  ? CHOLECYSTECTOMY    ? removal of cyst on buttocks    ? ? ?Social History: ?Social History  ? ?Socioeconomic History  ? Marital status: Married  ?  Spouse name: Not on file  ? Number of children: Not on file  ? Years of education: Not on file  ? Highest education level: Not on file  ?Occupational History  ? Not on file  ?Tobacco Use  ? Smoking status: Never  ? Smokeless tobacco: Not on file  ?Substance and Sexual Activity  ? Alcohol use: No  ? Drug use: No  ? Sexual activity: Yes  ?  Birth control/protection: None  ?Other Topics Concern  ? Not on file  ?Social History  Narrative  ? ** Merged History Encounter **  ?    ? ?Social Determinants of Health  ? ?Financial Resource Strain: Not on file  ?Food Insecurity: No Food Insecurity  ? Worried About Charity fundraiser in the Last Year: Never true  ? Ran Out of Food in the Last Year: Never true  ?Transportation Needs: No Transportation Needs  ? Lack of Transportation (Medical): No  ? Lack of Transportation (Non-Medical): No  ?Physical Activity: Not on file  ?Stress: Not on file  ?Social Connections: Not on file  ?Intimate Partner Violence: Not on file  ? ? ?Family History: ?Family History  ?Problem Relation Age of Onset  ? Hypertension Mother   ? COPD Mother   ? Heart disease Mother   ? Cancer Father   ?     unknown primary; mets  ? Lung cancer Brother 58  ? Lung cancer Brother 77  ? Breast cancer Niece 14  ? ? ?Review of Systems: ?ROS ?Denies any recent hospitalizations or traumas. ? ?Physical Exam: ?Vital Signs ?BP (!) 144/63 (Patient Position: Sitting)   Pulse 97   Ht '5\' 3"'$  (1.6 m)   Wt 162 lb 9.6 oz (73.8 kg)   SpO2 97%   BMI 28.80 kg/m?  ? ?Physical Exam ?Constitutional:   ?   General: Not in acute distress. ?   Appearance: Normal appearance. Not ill-appearing.  ?HENT:  ?   Head: Normocephalic and atraumatic.  ?Eyes:  ?   Pupils: Pupils are equal, round. ?Cardiovascular:  ?   Rate and Rhythm: Normal rate. ?   Pulses: Normal pulses.  ?Pulmonary:  ?   Effort: No respiratory distress or increased work of breathing.  Speaks in full sentences. ?Abdominal:  ?   General: Abdomen is flat. No distension.   ?Musculoskeletal: Normal range of motion. No lower extremity swelling or edema.  Scattered spider veins, but no varicosities. ?Skin: ?   General: Skin is warm and dry.  ?   Findings: No erythema or rash.  ?Neurological:  ?   Mental Status: Alert and oriented to person, place, and time.  ?Psychiatric:     ?   Mood and Affect: Mood normal.     ?   Behavior: Behavior normal.  ? ? ?Assessment/Plan: ?The patient is scheduled for  bilateral mastectomies with aesthetic closure with Dr. Marla Roe.  Risks, benefits, and alternatives of procedure discussed, questions answered and consent obtained.   ? ?Smoking Status: Non-smoker. ?Last Mammogram: Planning bilateral total mastectomy for right-sided DCIS. ? ?Caprini Score: 7; Risk Factors include: Age, BMI greater than 25, breast cancer, and length of planned surgery. Recommendation for mechanical and possibly pharmacological prophylaxis.  Will discuss with surgeon and: Lovenox if indicated.  Otherwise, encourage early ambulation.  ? ?  Pictures obtained: 11/27/2021 ? ?Post-op Rx sent to pharmacy: Zofran ODT, oxycodone solution 5 mg every 8 hours as needed, Keflex suspension. ? ?Patient was provided with the General Surgical Risk consent document and Pain Medication Agreement prior to their appointment.  They had adequate time to read through the risk consent documents and Pain Medication Agreement. We also discussed them in person together during this preop appointment. All of their questions were answered to their satisfaction.  Recommended calling if they have any further questions.  Risk consent form and Pain Medication Agreement to be scanned into patient's chart. ? ? ?Electronically signed by: Krista Blue, PA-C 12/11/2021 12:28 PM ?

## 2021-12-11 ENCOUNTER — Encounter: Payer: Self-pay | Admitting: Physician Assistant

## 2021-12-11 ENCOUNTER — Ambulatory Visit (INDEPENDENT_AMBULATORY_CARE_PROVIDER_SITE_OTHER): Payer: Medicare Other | Admitting: Physician Assistant

## 2021-12-11 VITALS — BP 144/63 | HR 97 | Ht 63.0 in | Wt 162.6 lb

## 2021-12-11 DIAGNOSIS — D0511 Intraductal carcinoma in situ of right breast: Secondary | ICD-10-CM

## 2021-12-11 MED ORDER — CEPHALEXIN 250 MG/5ML PO SUSR: 500.0000 mg | Freq: Three times a day (TID) | ORAL | 0 refills | Status: AC

## 2021-12-11 MED ORDER — OXYCODONE HCL 5 MG/5ML PO SOLN
5.0000 mg | Freq: Three times a day (TID) | ORAL | 0 refills | Status: AC | PRN
Start: 2021-12-11 — End: 2021-12-18

## 2021-12-11 MED ORDER — ONDANSETRON 4 MG PO TBDP: 4.0000 mg | ORAL_TABLET | Freq: Three times a day (TID) | ORAL | 0 refills | Status: AC | PRN

## 2021-12-18 ENCOUNTER — Encounter (HOSPITAL_BASED_OUTPATIENT_CLINIC_OR_DEPARTMENT_OTHER): Payer: Self-pay | Admitting: Surgery

## 2021-12-18 ENCOUNTER — Other Ambulatory Visit: Payer: Self-pay

## 2021-12-20 ENCOUNTER — Encounter: Payer: Self-pay | Admitting: *Deleted

## 2021-12-25 ENCOUNTER — Encounter (HOSPITAL_BASED_OUTPATIENT_CLINIC_OR_DEPARTMENT_OTHER)
Admission: RE | Admit: 2021-12-25 | Discharge: 2021-12-25 | Disposition: A | Discharge status: Home / Self Care | Payer: Medicare Other | Source: Ambulatory Visit | Attending: Surgery | Admitting: Surgery

## 2021-12-25 DIAGNOSIS — Z01812 Encounter for preprocedural laboratory examination: Secondary | ICD-10-CM | POA: Insufficient documentation

## 2021-12-25 LAB — BASIC METABOLIC PANEL
Anion gap: 12 (ref 5–15)
BUN: 15 mg/dL (ref 8–23)
CO2: 27 mmol/L (ref 22–32)
Calcium: 9.5 mg/dL (ref 8.9–10.3)
Chloride: 95 mmol/L — ABNORMAL LOW (ref 98–111)
Creatinine, Ser: 0.88 mg/dL (ref 0.44–1.00)
GFR, Estimated: >60 mL/min (ref >60)
Glucose, Bld: 113 mg/dL — ABNORMAL HIGH (ref 70–99)
Potassium: 3.3 mmol/L — ABNORMAL LOW (ref 3.5–5.1)
Sodium: 134 mmol/L — ABNORMAL LOW (ref 135–145)

## 2021-12-25 MED ORDER — ENSURE PRE-SURGERY PO LIQD: 296.0000 mL | Freq: Once | ORAL | Status: DC

## 2021-12-25 NOTE — Progress Notes (Signed)

## 2021-12-26 NOTE — H&P (Signed)
? ?PROVIDER: Beverlee Nims, MD ? ?MRN: [id number] ?DOB: [birth year] ? ?Subjective  ? ?Chief Complaint: Consultation (Discuss Surgery ) ? ? ?History of Present Illness: ?Kathy Dickerson is a 66 y.o. female who is seen today for discussion of her upcoming surgery for DCIS of the right breast. She has now met with plastic surgery and now wishes to proceed with bilateral total mastectomies with immediate reconstruction. 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 ? ?She has undergone 3 more biopsies of the right breast. This showed an area of [facility] as well as more ductal carcinoma in situ. The DCIS now measures at least 9 cm.. ? ? ? ?Review of Systems: ?A complete review of systems was obtained from the patient. I have reviewed this information and discussed as appropriate with the patient. See HPI as well for other ROS. ? ?ROS  ? ?Medical History: ?Past Medical History:  ?Diagnosis Date  ? Arthritis  ? GERD (gastroesophageal reflux disease)  ? Hyperlipidemia  ? Hypertension  ? ?Patient Active Problem List  ?Diagnosis  ? Ductal carcinoma in situ (DCIS) of right breast  ? Chronic gouty arthritis  ? Dyslipidemia  ? Family history of breast cancer  ? Gastroesophageal reflux disease without esophagitis  ? Genetic testing  ? Hypertension  ? Seasonal allergic rhinitis  ? ?Past Surgical History:  ?Procedure Laterality Date  ? CYSTECTOMY N/A  ?1974 or 1975  ? LAPAROSCOPIC CHOLECYSTECTOMY N/A  ?2003 or 2004  ? ? ?Allergies  ?Allergen Reactions  ? Erythromycin Nausea And Vomiting  ? Sulfamethoxazole-Trimethoprim Dizziness  ? ?Current Outpatient Medications on File Prior to Visit  ?Medication Sig Dispense Refill  ? losartan (COZAAR) 100 MG tablet Take 100 mg by mouth once daily  ? multivitamin tablet Take 1 tablet by mouth once daily  ? ?No current facility-administered medications on file prior to visit.  ? ?Family History  ?Problem Relation Age of Onset  ? High blood pressure  (Hypertension) Mother  ? ? ?Social History  ? ?Tobacco Use  ?Smoking Status Former  ? Types: Cigarettes  ? Quit date: 42  ? Years since quitting: 40.3  ?Smokeless Tobacco Never  ? ? ?Social History  ? ?Socioeconomic History  ? Marital status: Unknown  ?Tobacco Use  ? Smoking status: Former  ?Types: Cigarettes  ?Quit date: 57  ?Years since quitting: 40.3  ? Smokeless tobacco: Never  ?Vaping Use  ? Vaping Use: Never used  ?Substance and Sexual Activity  ? Alcohol use: Never  ? Drug use: Never  ? ?Objective:  ? ?There were no vitals filed for this visit.  ?There is no height or weight on file to calculate BMI. ? ?Physical Exam  ? ?She appears well ? ?CV RRR ? ?Lungs clear ? ?Breast normal in appearance with no palpable masses ? ?No axillary adenopathy ? ?Assessment and Plan:  ? ?Diagnoses and all orders for this visit: ? ?Neoplasm of right breast, primary tumor staging category Tis: ductal carcinoma in situ (DCIS) ? ? ?Previously I gave her copies of all her pathology results. I drew her pictures corresponding to the multiple biopsies she has had on the right breast. Given that the area of DCIS is at least 9 cm and she still has a separate area of [place] it would need to be removed, I do not believe she is a candidate for lumpectomy given the extent of her disease. I would recommend a right breast mastectomy. She would  still need at least a lumpectomy on the left breast for the area of [facility]. ? ?She has see plastic surgery ? ? ? ?We again discussed the reasons for surgery with her at length. I would inject mag trace in the nipple areolar complex on the right breast just in case invasive cancer is found on the final pathology. We again discussed the risk of surgery as well. She is already scheduled for total mastectomies bilaterally with immediate reconstruction by Dr. Marla Roe.  ?

## 2021-12-27 ENCOUNTER — Other Ambulatory Visit: Payer: Self-pay

## 2021-12-27 ENCOUNTER — Encounter (HOSPITAL_BASED_OUTPATIENT_CLINIC_OR_DEPARTMENT_OTHER)
Admission: RE | Disposition: A | Discharge status: Home / Self Care | Payer: Self-pay | Source: Home / Self Care | Attending: Plastic Surgery

## 2021-12-27 ENCOUNTER — Ambulatory Visit (HOSPITAL_BASED_OUTPATIENT_CLINIC_OR_DEPARTMENT_OTHER): Payer: Medicare Other | Admitting: Anesthesiology

## 2021-12-27 ENCOUNTER — Encounter (HOSPITAL_BASED_OUTPATIENT_CLINIC_OR_DEPARTMENT_OTHER): Payer: Self-pay | Admitting: Surgery

## 2021-12-27 ENCOUNTER — Observation Stay (HOSPITAL_BASED_OUTPATIENT_CLINIC_OR_DEPARTMENT_OTHER)
Admission: RE | Admit: 2021-12-27 | Discharge: 2021-12-28 | Disposition: A | Discharge status: Home / Self Care | Payer: Medicare Other | Attending: Plastic Surgery | Admitting: Plastic Surgery

## 2021-12-27 DIAGNOSIS — I10 Essential (primary) hypertension: Secondary | ICD-10-CM | POA: Insufficient documentation

## 2021-12-27 DIAGNOSIS — N6092 Unspecified benign mammary dysplasia of left breast: Secondary | ICD-10-CM | POA: Diagnosis not present

## 2021-12-27 DIAGNOSIS — D0511 Intraductal carcinoma in situ of right breast: Secondary | ICD-10-CM | POA: Insufficient documentation

## 2021-12-27 DIAGNOSIS — Z87891 Personal history of nicotine dependence: Secondary | ICD-10-CM | POA: Diagnosis not present

## 2021-12-27 DIAGNOSIS — Z421 Encounter for breast reconstruction following mastectomy: Secondary | ICD-10-CM | POA: Diagnosis not present

## 2021-12-27 DIAGNOSIS — C50919 Malignant neoplasm of unspecified site of unspecified female breast: Secondary | ICD-10-CM | POA: Diagnosis present

## 2021-12-27 DIAGNOSIS — N6082 Other benign mammary dysplasias of left breast: Secondary | ICD-10-CM | POA: Diagnosis not present

## 2021-12-27 DIAGNOSIS — D0512 Intraductal carcinoma in situ of left breast: Principal | ICD-10-CM | POA: Insufficient documentation

## 2021-12-27 HISTORY — DX: Other specified postprocedural states: Z98.890

## 2021-12-27 HISTORY — PX: BREAST RECONSTRUCTION: SHX9

## 2021-12-27 HISTORY — PX: TOTAL MASTECTOMY: SHX6129

## 2021-12-27 SURGERY — MASTECTOMY, SIMPLE
Anesthesia: General | Site: Breast | Laterality: Bilateral

## 2021-12-27 MED ORDER — KETOROLAC TROMETHAMINE 15 MG/ML IJ SOLN
15.0000 mg | Freq: Once | INTRAMUSCULAR | Status: DC
  Filled 2021-12-27: qty 1

## 2021-12-27 MED ORDER — LIDOCAINE HCL (CARDIAC) PF 100 MG/5ML IV SOSY
PREFILLED_SYRINGE | INTRAVENOUS | Status: DC | PRN
  Administered 2021-12-27: 100 mg via INTRATRACHEAL

## 2021-12-27 MED ORDER — DEXAMETHASONE SODIUM PHOSPHATE 10 MG/ML IJ SOLN
INTRAMUSCULAR | Status: DC | PRN
  Administered 2021-12-27: 10 mg via INTRAVENOUS

## 2021-12-27 MED ORDER — METHOCARBAMOL 500 MG PO TABS
500.0000 mg | ORAL_TABLET | Freq: Three times a day (TID) | ORAL | Status: DC | PRN
  Administered 2021-12-27 – 2021-12-28 (×2): 500 mg via ORAL
  Filled 2021-12-27 (×2): qty 1

## 2021-12-27 MED ORDER — MIDAZOLAM HCL 2 MG/2ML IJ SOLN
INTRAMUSCULAR | Status: AC
  Filled 2021-12-27: qty 2

## 2021-12-27 MED ORDER — ROPIVACAINE HCL 5 MG/ML IJ SOLN
INTRAMUSCULAR | Status: DC | PRN
  Administered 2021-12-27 (×12): 5 mL via PERINEURAL

## 2021-12-27 MED ORDER — CEFAZOLIN SODIUM-DEXTROSE 2-4 GM/100ML-% IV SOLN
2.0000 g | INTRAVENOUS | Status: AC
  Filled 2021-12-27: qty 100

## 2021-12-27 MED ORDER — ONDANSETRON HCL 4 MG/2ML IJ SOLN
4.0000 mg | Freq: Four times a day (QID) | INTRAMUSCULAR | Status: DC | PRN
Start: 2021-12-27 — End: 2021-12-28

## 2021-12-27 MED ORDER — HYDROMORPHONE HCL 1 MG/ML IJ SOLN: 0.2500 mg | INTRAMUSCULAR | Status: DC | PRN

## 2021-12-27 MED ORDER — METHOCARBAMOL 1000 MG/10ML IJ SOLN: 500.0000 mg | Freq: Three times a day (TID) | INTRAVENOUS | Status: DC | PRN

## 2021-12-27 MED ORDER — ACETAMINOPHEN 500 MG PO TABS: 1000.0000 mg | ORAL_TABLET | ORAL | Status: AC

## 2021-12-27 MED ORDER — IBUPROFEN 200 MG PO TABS
400.0000 mg | ORAL_TABLET | Freq: Four times a day (QID) | ORAL | Status: DC
  Administered 2021-12-27 – 2021-12-28 (×2): 400 mg via ORAL
  Filled 2021-12-27 (×2): qty 2

## 2021-12-27 MED ORDER — ROCURONIUM BROMIDE 10 MG/ML (PF) SYRINGE
PREFILLED_SYRINGE | INTRAVENOUS | Status: AC
  Filled 2021-12-27: qty 10

## 2021-12-27 MED ORDER — LIDOCAINE 2% (20 MG/ML) 5 ML SYRINGE
INTRAMUSCULAR | Status: AC
  Filled 2021-12-27: qty 5

## 2021-12-27 MED ORDER — PHENYLEPHRINE 80 MCG/ML (10ML) SYRINGE FOR IV PUSH (FOR BLOOD PRESSURE SUPPORT)
PREFILLED_SYRINGE | INTRAVENOUS | Status: AC
  Filled 2021-12-27: qty 10

## 2021-12-27 MED ORDER — ONDANSETRON HCL 4 MG/2ML IJ SOLN: 4.0000 mg | Freq: Once | INTRAMUSCULAR | Status: DC | PRN

## 2021-12-27 MED ORDER — DIPHENHYDRAMINE HCL 12.5 MG/5ML PO ELIX: 12.5000 mg | ORAL_SOLUTION | Freq: Four times a day (QID) | ORAL | Status: DC | PRN

## 2021-12-27 MED ORDER — ONDANSETRON 4 MG PO TBDP: 4.0000 mg | ORAL_TABLET | Freq: Four times a day (QID) | ORAL | Status: DC | PRN

## 2021-12-27 MED ORDER — ONDANSETRON HCL 4 MG/2ML IJ SOLN
INTRAMUSCULAR | Status: AC
  Filled 2021-12-27: qty 2

## 2021-12-27 MED ORDER — CHLORHEXIDINE GLUCONATE CLOTH 2 % EX PADS: 6.0000 | MEDICATED_PAD | Freq: Once | CUTANEOUS | Status: DC

## 2021-12-27 MED ORDER — FENTANYL CITRATE (PF) 100 MCG/2ML IJ SOLN
INTRAMUSCULAR | Status: AC
Start: 2021-12-27 — End: ?
  Filled 2021-12-27: qty 2

## 2021-12-27 MED ORDER — LACTATED RINGERS IV SOLN: INTRAVENOUS | Status: DC

## 2021-12-27 MED ORDER — CEFAZOLIN SODIUM-DEXTROSE 2-4 GM/100ML-% IV SOLN
INTRAVENOUS | Status: AC
  Filled 2021-12-27: qty 100

## 2021-12-27 MED ORDER — ACETAMINOPHEN 500 MG PO TABS
ORAL_TABLET | ORAL | Status: AC
  Filled 2021-12-27: qty 2

## 2021-12-27 MED ORDER — EPHEDRINE SULFATE (PRESSORS) 50 MG/ML IJ SOLN
INTRAMUSCULAR | Status: DC | PRN
  Administered 2021-12-27: 10 mg via INTRAVENOUS
  Administered 2021-12-27: 15 mg via INTRAVENOUS

## 2021-12-27 MED ORDER — PROPOFOL 10 MG/ML IV BOLUS
INTRAVENOUS | Status: AC
  Filled 2021-12-27: qty 20

## 2021-12-27 MED ORDER — MIDAZOLAM HCL 2 MG/2ML IJ SOLN
3.0000 mg | Freq: Once | INTRAMUSCULAR | Status: AC
  Administered 2021-12-27: 3 mg via INTRAVENOUS

## 2021-12-27 MED ORDER — KCL IN DEXTROSE-NACL 20-5-0.45 MEQ/L-%-% IV SOLN
INTRAVENOUS | Status: DC
  Filled 2021-12-27: qty 1000

## 2021-12-27 MED ORDER — SENNA 8.6 MG PO TABS
1.0000 | ORAL_TABLET | Freq: Two times a day (BID) | ORAL | Status: DC
  Administered 2021-12-27: 8.6 mg via ORAL
  Filled 2021-12-27 (×2): qty 1

## 2021-12-27 MED ORDER — EPHEDRINE 5 MG/ML INJ
INTRAVENOUS | Status: AC
Start: 2021-12-27 — End: ?
  Filled 2021-12-27: qty 5

## 2021-12-27 MED ORDER — MEPERIDINE HCL 25 MG/ML IJ SOLN: 6.2500 mg | INTRAMUSCULAR | Status: DC | PRN

## 2021-12-27 MED ORDER — ZOLPIDEM TARTRATE 5 MG PO TABS: 5.0000 mg | ORAL_TABLET | Freq: Every evening | ORAL | Status: DC | PRN

## 2021-12-27 MED ORDER — MAGTRACE LYMPHATIC TRACER
INTRAMUSCULAR | Status: DC | PRN
  Administered 2021-12-27: 2 mL via INTRAMUSCULAR

## 2021-12-27 MED ORDER — FENTANYL CITRATE (PF) 100 MCG/2ML IJ SOLN
150.0000 ug | Freq: Once | INTRAMUSCULAR | Status: AC
  Administered 2021-12-27: 150 ug via INTRAVENOUS

## 2021-12-27 MED ORDER — POLYETHYLENE GLYCOL 3350 17 G PO PACK: 17.0000 g | PACK | Freq: Every day | ORAL | Status: DC | PRN

## 2021-12-27 MED ORDER — FENTANYL CITRATE (PF) 100 MCG/2ML IJ SOLN
INTRAMUSCULAR | Status: DC | PRN
  Administered 2021-12-27 (×2): 25 ug via INTRAVENOUS
  Administered 2021-12-27: 50 ug via INTRAVENOUS

## 2021-12-27 MED ORDER — CEFAZOLIN SODIUM-DEXTROSE 2-4 GM/100ML-% IV SOLN
2.0000 g | INTRAVENOUS | Status: AC
  Administered 2021-12-27: 2 g via INTRAVENOUS

## 2021-12-27 MED ORDER — BISACODYL 10 MG RE SUPP: 10.0000 mg | Freq: Every day | RECTAL | Status: DC | PRN

## 2021-12-27 MED ORDER — ONDANSETRON HCL 4 MG/2ML IJ SOLN
INTRAMUSCULAR | Status: DC | PRN
  Administered 2021-12-27: 4 mg via INTRAVENOUS

## 2021-12-27 MED ORDER — HYDROMORPHONE HCL 1 MG/ML IJ SOLN: 1.0000 mg | INTRAMUSCULAR | Status: DC | PRN

## 2021-12-27 MED ORDER — DIPHENHYDRAMINE HCL 50 MG/ML IJ SOLN: 12.5000 mg | Freq: Four times a day (QID) | INTRAMUSCULAR | Status: DC | PRN

## 2021-12-27 MED ORDER — FENTANYL CITRATE (PF) 100 MCG/2ML IJ SOLN
INTRAMUSCULAR | Status: AC
  Filled 2021-12-27: qty 2

## 2021-12-27 MED ORDER — CLONIDINE HCL (ANALGESIA) 100 MCG/ML EP SOLN
EPIDURAL | Status: DC | PRN
  Administered 2021-12-27 (×2): 100 ug

## 2021-12-27 MED ORDER — PHENYLEPHRINE HCL (PRESSORS) 10 MG/ML IV SOLN
INTRAVENOUS | Status: DC | PRN
  Administered 2021-12-27: 40 ug via INTRAVENOUS
  Administered 2021-12-27 (×2): 80 ug via INTRAVENOUS

## 2021-12-27 MED ORDER — DEXAMETHASONE SODIUM PHOSPHATE 10 MG/ML IJ SOLN
INTRAMUSCULAR | Status: DC | PRN
  Administered 2021-12-27 (×2): 10 mg

## 2021-12-27 MED ORDER — PROPOFOL 10 MG/ML IV BOLUS
INTRAVENOUS | Status: DC | PRN
  Administered 2021-12-27: 140 mg via INTRAVENOUS

## 2021-12-27 MED ORDER — CEFAZOLIN SODIUM-DEXTROSE 2-4 GM/100ML-% IV SOLN
2.0000 g | Freq: Three times a day (TID) | INTRAVENOUS | Status: DC
  Administered 2021-12-27 – 2021-12-28 (×2): 2 g via INTRAVENOUS
  Filled 2021-12-27: qty 100

## 2021-12-27 MED ORDER — ACETAMINOPHEN 325 MG PO TABS
325.0000 mg | ORAL_TABLET | Freq: Four times a day (QID) | ORAL | Status: DC
  Administered 2021-12-27: 325 mg via ORAL
  Filled 2021-12-27 (×2): qty 1

## 2021-12-27 MED ORDER — DEXAMETHASONE SODIUM PHOSPHATE 10 MG/ML IJ SOLN
INTRAMUSCULAR | Status: AC
  Filled 2021-12-27: qty 1

## 2021-12-27 MED ORDER — HYDROCODONE-ACETAMINOPHEN 5-325 MG PO TABS
1.0000 | ORAL_TABLET | ORAL | Status: DC | PRN
  Administered 2021-12-27: 1 via ORAL
  Filled 2021-12-27: qty 1

## 2021-12-27 SURGICAL SUPPLY — 78 items
APPLIER CLIP 9.375 MED OPEN (MISCELLANEOUS) ×2
BAG DECANTER FOR FLEXI CONT (MISCELLANEOUS) ×1 IMPLANT
BINDER BREAST LRG (GAUZE/BANDAGES/DRESSINGS) IMPLANT
BINDER BREAST MEDIUM (GAUZE/BANDAGES/DRESSINGS) IMPLANT
BINDER BREAST XLRG (GAUZE/BANDAGES/DRESSINGS) ×1 IMPLANT
BINDER BREAST XXLRG (GAUZE/BANDAGES/DRESSINGS) IMPLANT
BIOPATCH RED 1 DISK 7.0 (GAUZE/BANDAGES/DRESSINGS) ×2 IMPLANT
BLADE CLIPPER SURG (BLADE) IMPLANT
BLADE HEX COATED 2.75 (ELECTRODE) ×1 IMPLANT
BLADE SURG 10 STRL SS (BLADE) ×2 IMPLANT
BLADE SURG 15 STRL LF DISP TIS (BLADE) ×1 IMPLANT
BLADE SURG 15 STRL SS (BLADE) ×4
BNDG GAUZE ELAST 4 BULKY (GAUZE/BANDAGES/DRESSINGS) ×2 IMPLANT
CANISTER SUCT 1200ML W/VALVE (MISCELLANEOUS) ×2 IMPLANT
CHLORAPREP W/TINT 26 (MISCELLANEOUS) ×2 IMPLANT
CLIP APPLIE 9.375 MED OPEN (MISCELLANEOUS) ×1 IMPLANT
COVER BACK TABLE 60X90IN (DRAPES) ×2 IMPLANT
COVER MAYO STAND STRL (DRAPES) ×3 IMPLANT
DERMABOND ADVANCED (GAUZE/BANDAGES/DRESSINGS) ×2
DERMABOND ADVANCED .7 DNX12 (GAUZE/BANDAGES/DRESSINGS) ×1 IMPLANT
DRAIN CHANNEL 19F RND (DRAIN) ×3 IMPLANT
DRAPE LAPAROSCOPIC ABDOMINAL (DRAPES) ×2 IMPLANT
DRAPE UTILITY XL STRL (DRAPES) ×2 IMPLANT
DRSG OPSITE POSTOP 4X6 (GAUZE/BANDAGES/DRESSINGS) ×2 IMPLANT
DRSG PAD ABDOMINAL 8X10 ST (GAUZE/BANDAGES/DRESSINGS) ×4 IMPLANT
DRSG TEGADERM 2-3/8X2-3/4 SM (GAUZE/BANDAGES/DRESSINGS) ×2 IMPLANT
ELECT BLADE 4.0 EZ CLEAN MEGAD (MISCELLANEOUS)
ELECT BLADE 6.5 EXT (BLADE) IMPLANT
ELECT REM PT RETURN 9FT ADLT (ELECTROSURGICAL) ×4
ELECTRODE BLDE 4.0 EZ CLN MEGD (MISCELLANEOUS) ×1 IMPLANT
ELECTRODE REM PT RTRN 9FT ADLT (ELECTROSURGICAL) ×1 IMPLANT
EVACUATOR SILICONE 100CC (DRAIN) ×3 IMPLANT
FUNNEL KELLER 2 DISP (MISCELLANEOUS) IMPLANT
GAUZE SPONGE 4X4 12PLY STRL LF (GAUZE/BANDAGES/DRESSINGS) IMPLANT
GLOVE BIO SURGEON STRL SZ 6.5 (GLOVE) ×4 IMPLANT
GLOVE SURG SIGNA 7.5 PF LTX (GLOVE) ×2 IMPLANT
GOWN STRL REUS W/ TWL LRG LVL3 (GOWN DISPOSABLE) ×3 IMPLANT
GOWN STRL REUS W/ TWL XL LVL3 (GOWN DISPOSABLE) ×1 IMPLANT
GOWN STRL REUS W/TWL LRG LVL3 (GOWN DISPOSABLE) ×4
GOWN STRL REUS W/TWL XL LVL3 (GOWN DISPOSABLE) ×2
HEMOSTAT SURGICEL 2X14 (HEMOSTASIS) IMPLANT
IV NS 500ML (IV SOLUTION)
IV NS 500ML BAXH (IV SOLUTION) ×1 IMPLANT
KIT FILL ASEPTIC TRANSFER (MISCELLANEOUS) IMPLANT
NDL HYPO 25X1 1.5 SAFETY (NEEDLE) ×1 IMPLANT
NDL SAFETY ECLIPSE 18X1.5 (NEEDLE) ×1 IMPLANT
NEEDLE HYPO 18GX1.5 SHARP (NEEDLE)
NEEDLE HYPO 25X1 1.5 SAFETY (NEEDLE) ×2 IMPLANT
NS IRRIG 1000ML POUR BTL (IV SOLUTION) ×2 IMPLANT
PACK BASIN DAY SURGERY FS (CUSTOM PROCEDURE TRAY) ×2 IMPLANT
PENCIL SMOKE EVACUATOR (MISCELLANEOUS) ×3 IMPLANT
PIN SAFETY STERILE (MISCELLANEOUS) ×2 IMPLANT
SLEEVE SCD COMPRESS KNEE MED (STOCKING) ×2 IMPLANT
SPIKE FLUID TRANSFER (MISCELLANEOUS) IMPLANT
SPONGE T-LAP 18X18 ~~LOC~~+RFID (SPONGE) ×5 IMPLANT
STRIP SUTURE WOUND CLOSURE 1/2 (MISCELLANEOUS) ×3 IMPLANT
SUT ETHILON 3 0 PS 1 (SUTURE) ×1 IMPLANT
SUT MNCRL AB 4-0 PS2 18 (SUTURE) ×6 IMPLANT
SUT MON AB 3-0 SH 27 (SUTURE) ×4
SUT MON AB 3-0 SH27 (SUTURE) ×1 IMPLANT
SUT MON AB 5-0 PS2 18 (SUTURE) ×1 IMPLANT
SUT PDS 3-0 CT2 (SUTURE) ×8
SUT PDS AB 2-0 CT2 27 (SUTURE) IMPLANT
SUT PDS II 3-0 CT2 27 ABS (SUTURE) IMPLANT
SUT SILK 2 0 SH (SUTURE) ×1 IMPLANT
SUT SILK 3 0 PS 1 (SUTURE) ×2 IMPLANT
SUT VIC AB 3-0 SH 27 (SUTURE) ×1
SUT VIC AB 3-0 SH 27X BRD (SUTURE) ×2 IMPLANT
SUT VICRYL 4-0 PS2 18IN ABS (SUTURE) IMPLANT
SYR BULB EAR ULCER 3OZ GRN STR (SYRINGE) ×1 IMPLANT
SYR BULB IRRIG 60ML STRL (SYRINGE) ×2 IMPLANT
SYR CONTROL 10ML LL (SYRINGE) ×1 IMPLANT
TOWEL GREEN STERILE FF (TOWEL DISPOSABLE) ×4 IMPLANT
TRACER MAGTRACE VIAL (MISCELLANEOUS) ×1 IMPLANT
TRAY DSU PREP LF (CUSTOM PROCEDURE TRAY) ×1 IMPLANT
TUBE CONNECTING 20X1/4 (TUBING) ×2 IMPLANT
UNDERPAD 30X36 HEAVY ABSORB (UNDERPADS AND DIAPERS) ×4 IMPLANT
YANKAUER SUCT BULB TIP NO VENT (SUCTIONS) ×2 IMPLANT

## 2021-12-27 NOTE — Anesthesia Procedure Notes (Signed)
Procedure Name: LMA Insertion ?Date/Time: 12/27/2021 11:21 AM ?Performed by: Glory Buff, CRNA ?Pre-anesthesia Checklist: Patient identified, Emergency Drugs available, Suction available and Patient being monitored ?Patient Re-evaluated:Patient Re-evaluated prior to induction ?Oxygen Delivery Method: Circle system utilized ?Preoxygenation: Pre-oxygenation with 100% oxygen ?Induction Type: IV induction ?LMA: LMA inserted ?LMA Size: 4.0 ?Number of attempts: 1 ?Placement Confirmation: positive ETCO2 ?Tube secured with: Tape ?Dental Injury: Teeth and Oropharynx as per pre-operative assessment  ? ? ? ? ?

## 2021-12-27 NOTE — Discharge Instructions (Addendum)
INSTRUCTIONS FOR AFTER BREAST SURGERY   You will likely have some questions about what to expect following your operation.  The following information will help you and your family understand what to expect when you are discharged from the hospital.  Following these guidelines will help ensure a smooth recovery and reduce risks of complications.  Postoperative instructions include information on: diet, wound care, medications and physical activity.  AFTER SURGERY Expect to go home after the procedure.  In some cases, you may need to spend one night in the hospital for observation.  DIET Breast surgery does not require a specific diet.  However, the healthier you eat the better your body can start healing. It is important to increasing your protein intake.  This means limiting the foods with sugar and carbohydrates.  Focus on vegetables and some meat.  If you have any liposuction during your procedure be sure to drink water.  If your urine is bright yellow, then it is concentrated, and you need to drink more water.  As a general rule after surgery, you should have 8 ounces of water every hour while awake.  If you find you are persistently nauseated or unable to take in liquids let us know.  NO TOBACCO USE or EXPOSURE.  This will slow your healing process and increase the risk of a wound.  WOUND CARE Leave the ACE wrap or binder on for 3 days . Use fragrance free soap.   After 3 days you can remove the ACE wrap or binder to shower. Once dry apply ACE wrap, binder or sports bra.  Use a mild soap like Dial, Dove and Ivory. You may have Topifoam or Lipofoam on.  It is soft and spongy and helps keep you from getting creases if you have liposuction.  This can be removed before the shower and then replaced.  If you need more it is available on [employer] (Lipofoam). If you have steri-strips / tape directly attached to your skin leave them in place. It is OK to get these wet.   No baths, pools or hot tubs for four  weeks. We close your incision to leave the smallest and best-looking scar. No ointment or creams on your incisions until given the go ahead.  Especially not Neosporin (Too many skin reactions with this one).  A few weeks after surgery you can use Mederma and start massaging the scar. We ask you to wear your binder or sports bra for the first 6 weeks around the clock, including while sleeping. This provides added comfort and helps reduce the fluid accumulation at the surgery site.  ACTIVITY No heavy lifting until cleared by the doctor.  This usually means no more than a half-gallon of milk.  It is OK to walk and climb stairs. In fact, moving your legs is very important to decrease your risk of a blood clot.  It will also help keep you from getting deconditioned.  Every 1 to 2 hours get up and walk for 5 minutes. This will help with a quicker recovery back to normal.  Let pain be your guide so you don't do too much.  This is not the time for spring cleaning and don't plan on taking care of anyone else.  This time is for you to recover,  You will be more comfortable if you sleep and rest with your head elevated either with a few pillows under you or in a recliner.  No stomach sleeping for a three months.  WORK Everyone   returns to work at different times. As a rough guide, most people take at least 1 - 2 weeks off prior to returning to work. If you need documentation for your job, bring the forms to your postoperative follow up visit.  DRIVING Arrange for someone to bring you home from the hospital.  You may be able to drive a few days after surgery but not while taking any narcotics or valium.  BOWEL MOVEMENTS Constipation can occur after anesthesia and while taking pain medication.  It is important to stay ahead for your comfort.  We recommend taking Milk of Magnesia (2 tablespoons; twice a day) while taking the pain pills.  MEDICATIONS You may be prescribed should start after surgery At your  preoperative visit for you history and physical you may have been given the following medications: An antibiotic: Start this medication when you get home and take according to the instructions on the bottle. Zofran 4 mg:  This is to treat nausea and vomiting.  You can take this every 6 hours as needed and only if needed. Valium 2 mg: This is for muscle tightness if you have an implant or expander. This will help relax your muscle which also helps with pain control.  This can be taken every 12 hours as needed. Don't drive after taking this medication. Norco (hydrocodone/acetaminophen) 5/325 mg:  This is only to be used after you have taken the motrin or the tylenol. Every 8 hours as needed.   Over the counter Medication to take: Ibuprofen (Motrin) 600 mg:  Take this every 6 hours.  If you have additional pain then take 500 mg of the tylenol every 8 hours.  Only take the Norco after you have tried these two. Miralax or stool softener of choice: Take this according to the bottle if you take the [place] Call your surgeon's office if any of the following occur: Fever 101 degrees F or greater Excessive bleeding or fluid from the incision site. Pain that increases over time without aid from the medications Redness, warmth, or pus draining from incision sites Persistent nausea or inability to take in liquids Severe misshapen area that underwent the operation.     About my Jackson-Pratt Bulb Drain  What is a Jackson-Pratt bulb? A Jackson-Pratt is a soft, round device used to collect drainage. It is connected to a long, thin drainage catheter, which is held in place by one or two small stiches near your surgical incision site. When the bulb is squeezed, it forms a vacuum, forcing the drainage to empty into the bulb.  Emptying the Jackson-Pratt bulb- To empty the bulb: 1. Release the plug on the top of the bulb. 2. Pour the bulb's contents into a measuring container which your nurse  will provide. 3. Record the time emptied and amount of drainage. Empty the drain(s) as often as your     doctor or nurse recommends.  Date                  Time                    Amount (Drain 1)                 Amount (Drain 2)  _____________________________________________________________________  _____________________________________________________________________  _____________________________________________________________________  _____________________________________________________________________  _____________________________________________________________________  _____________________________________________________________________  _____________________________________________________________________  _____________________________________________________________________  Squeezing the Jackson-Pratt Bulb- To squeeze the bulb: 1. Make sure the plug at the top of the bulb is open.  2. Squeeze the bulb tightly in your fist. You will hear air squeezing from the bulb. 3. Replace the plug while the bulb is squeezed. 4. Use a safety pin to attach the bulb to your clothing. This will keep the catheter from     pulling at the bulb insertion site.  When to call your doctor- Call your doctor if: Drain site becomes red, swollen or hot. You have a fever greater than 101 degrees F. There is oozing at the drain site. Drain falls out (apply a guaze bandage over the drain hole and secure it with tape). Drainage increases daily not related to activity patterns. (You will usually have more drainage when you are active than when you are resting.) Drainage has a bad odor.

## 2021-12-27 NOTE — Anesthesia Postprocedure Evaluation (Signed)
Anesthesia Post Note ? ?Patient: TANIESHA GLANZ ? ?Procedure(s) Performed: BILATERAL TOTAL MASTECTOMY (Bilateral: Breast) ?BILATERAL BREAST CLOSURE AFTER MASTECTOMY (Bilateral: Breast) ? ?  ? ?Patient location during evaluation: PACU ?Anesthesia Type: General ?Level of consciousness: awake ?Pain management: pain level controlled ?Vital Signs Assessment: post-procedure vital signs reviewed and stable ?Respiratory status: spontaneous breathing ?Cardiovascular status: stable ?Postop Assessment: no apparent nausea or vomiting ?Anesthetic complications: no ? ? ?No notable events documented. ? ?Last Vitals:  ?Vitals:  ? 12/27/21 1330 12/27/21 1345  ?BP: (!) 102/57 103/61  ?Pulse: 78 77  ?Resp: 16 16  ?Temp:    ?SpO2: 99% 99%  ?  ?Last Pain:  ?Vitals:  ? 12/27/21 1345  ?TempSrc:   ?PainSc: 0-No pain  ? ? ?  ?  ?  ?  ?  ?  ? ?Huston Foley ? ? ? ? ?

## 2021-12-27 NOTE — Interval H&P Note (Signed)
History and Physical Interval Note: ? ?12/27/2021 ?10:35 AM ? 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  The various methods of treatment have been discussed with the patient and family. After consideration of risks, benefits and other options for treatment, the patient has consented to  Procedure(s): ?BILATERAL TOTAL MASTECTOMY (Bilateral) ?BILATERAL BREAST CLOSURE AFTER MASTECTOMY (Bilateral) as a surgical intervention.  The patient's history has been reviewed, patient examined, no change in status, stable for surgery.  I have reviewed the patient's chart and labs.  Questions were answered to the patient's satisfaction.   ? ? ?Loel Lofty Kathy Dickerson ? ? ?

## 2021-12-27 NOTE — Op Note (Signed)
DATE OF OPERATION: 12/27/2021 ? ?LOCATION: [facility] ? ?PREOPERATIVE DIAGNOSIS: Right breast cancer ? ?POSTOPERATIVE DIAGNOSIS: Same ? ?PROCEDURE: Immediate oncoplastic breast closure with tissue re-arrangement 200 cm2 bilaterally ? ?SURGEON: Bannon Giammarco Sanger Luian Schumpert, DO ? ?ASSISTANT: Donnamarie Rossetti, PA ? ?EBL: 50 cc cc ? ?CONDITION: Stable ? ?COMPLICATIONS: None ? ?INDICATION: The patient, Kathy Dickerson, is a 66 y.o. female born on [birth year], is here for treatment of right breast cancer.  ? ?PROCEDURE DETAILS:  ?The patient was seen prior to surgery and marked.  The IV antibiotics were given. The patient was taken to the operating room and given a general anesthetic. A standard time out was performed and all information was confirmed by those in the room. SCDs were placed.   The patient was brought to the OR with the general surgery team.  She underwent bilateral mastectomies.  The patient was marked by the plastic surgery team prior to starting the case.  The upside down 'V' type marks were make.  Once general surgery had completed the mastectomies the patient was rendered to the plastic surgery team. Hemostasis was achieved with electrocautery.   ? ?Left: A drain was placed and secured to the chest wall with the 3-0 Silk.  The V edges were brought together with the 3-0 PDS.  The incisions were closed first with the 3-0 PDS. The 3-0 Monocryl was then used.  The final closure was done with the 4-0 Monocryl.  The lateral and medial flaps were brought to the middle portion of the breast for an aesthetic closure and included 200 cm2.   ? ?Right: A drain was placed and secured to the chest wall with the 3-0 Silk.  The V edges were brought together with the 3-0 PDS.  The incisions were closed first with the 3-0 PDS. The 3-0 Monocryl was then used.  The final closure was done with the 4-0 Monocryl.  The lateral and medial flaps were brought to the middle portion of the breast for an aesthetic closure  and included 200 cm2 . Derma bond and steri strips were applied.  The patient was placed in a breast binder. The patient was allowed to wake up and taken to recovery room in stable condition at the end of the case. The family was notified at the end of the case.  ? ?The advanced practice practitioner (APP) assisted throughout the case.  The APP was essential in retraction and counter traction when needed to make the case progress smoothly.  This retraction and assistance made it possible to see the tissue plans for the procedure.  The assistance was needed for blood control, tissue re-approximation and assisted with closure of the incision site. ? ?

## 2021-12-27 NOTE — Anesthesia Procedure Notes (Addendum)
?  Anesthesia Regional Block: Pectoralis block  ? ?Pre-Anesthetic Checklist: , timeout performed,  Correct Patient, Correct Site, Correct Laterality,  Correct Procedure, Correct Position, site marked,  Risks and benefits discussed,  Surgical consent,  Pre-op evaluation,  At surgeon's request and post-op pain management ? ?Laterality: Left and N/A ? ?Prep: chloraprep     ?  ?Needles:  ?Injection technique: Single-shot ? ?Needle Type: Echogenic Stimulator Needle   ? ? ?Needle Length: 9cm  ?Needle Gauge: 20  ? ?Needle insertion depth: 2 cm ? ? ?Additional Needles: ? ? ?Procedures:,,,, ultrasound used (permanent image in chart),,    ?Narrative:  ?Start time: 12/27/2021 10:45 AM ?End time: 12/27/2021 10:55 AM ?Injection made incrementally with aspirations every 5 mL. ? ?Performed by: Personally  ?Anesthesiologist: Lyn Hollingshead, MD ? ? ? ?

## 2021-12-27 NOTE — Anesthesia Preprocedure Evaluation (Addendum)
Anesthesia Evaluation  ?Patient identified by MRN, date of birth, ID band ?Patient awake ? ? ? ?Reviewed: ?Allergy & Precautions, NPO status , Patient's Chart, lab work & pertinent test results ? ?History of Anesthesia Complications ?(+) PONV and history of anesthetic complications ? ?Airway ?Mallampati: I ? ? ? ? ? ? Dental ?no notable dental hx. ? ?  ?Pulmonary ?neg pulmonary ROS,  ?  ?Pulmonary exam normal ? ? ? ? ? ? ? Cardiovascular ?hypertension, Pt. on medications ?Normal cardiovascular exam ? ? ?  ?Neuro/Psych ?negative neurological ROS ? negative psych ROS  ? GI/Hepatic ?Neg liver ROS, hiatal hernia, GERD  Medicated and Controlled,  ?Endo/Other  ?negative endocrine ROS ? Renal/GU ?negative Renal ROS  ?negative genitourinary ?  ?Musculoskeletal ?negative musculoskeletal ROS ?(+)  ? Abdominal ?Normal abdominal exam  (+)   ?Peds ? Hematology ?  ?Anesthesia Other Findings ? ? Reproductive/Obstetrics ? ?  ? ? ? ? ? ? ? ? ? ? ? ? ? ?  ?  ? ? ? ? ? ? ? ? ?Anesthesia Physical ?Anesthesia Plan ? ?ASA: 2 ? ?Anesthesia Plan: General  ? ?Post-op Pain Management: Regional block*  ? ?Induction: Intravenous ? ?PONV Risk Score and Plan: 4 or greater and Ondansetron, Dexamethasone and Midazolam ? ?Airway Management Planned: LMA ? ?Additional Equipment: None ? ?Intra-op Plan:  ? ?Post-operative Plan:  ? ?Informed Consent: I have reviewed the patients History and Physical, chart, labs and discussed the procedure including the risks, benefits and alternatives for the proposed anesthesia with the patient or authorized representative who has indicated his/her understanding and acceptance.  ? ? ? ?Dental advisory given ? ?Plan Discussed with: CRNA ? ?Anesthesia Plan Comments:   ? ? ? ? ? ? ?Anesthesia Quick Evaluation ? ?

## 2021-12-27 NOTE — Interval H&P Note (Signed)
History and Physical Interval Note: no change in H and P ? ?12/27/2021 ?10:41 AM ? 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  The various methods of treatment have been discussed with the patient and family. After consideration of risks, benefits and other options for treatment, the patient has consented to  Procedure(s): ?BILATERAL TOTAL MASTECTOMY (Bilateral) ?BILATERAL BREAST CLOSURE AFTER MASTECTOMY (Bilateral) as a surgical intervention.  The patient's history has been reviewed, patient examined, no change in status, stable for surgery.  I have reviewed the patient's chart and labs.  Questions were answered to the patient's satisfaction.   ? ? ?Coralie Keens ? ? ?

## 2021-12-27 NOTE — Anesthesia Procedure Notes (Addendum)
Anesthesia Regional Block: Pectoralis block  ? ?Pre-Anesthetic Checklist: , timeout performed,  Correct Patient, Correct Site, Correct Laterality,  Correct Procedure, Correct Position, site marked,  Risks and benefits discussed,  Surgical consent,  Pre-op evaluation,  At surgeon's request and post-op pain management ? ?Laterality: Right and N/A ? ?Prep: chloraprep     ?  ?Needles:  ?Injection technique: Single-shot ? ?Needle Type: Echogenic Stimulator Needle   ? ? ?Needle Length: 9cm  ?Needle Gauge: 20  ? ?Needle insertion depth: 1.5 cm ? ? ?Additional Needles: ? ? ?Procedures:,,,, ultrasound used (permanent image in chart),,    ?Narrative:  ?Start time: 12/27/2021 10:56 AM ?End time: 12/27/2021 11:06 AM ?Injection made incrementally with aspirations every 5 mL. ? ?Performed by: Personally  ?Anesthesiologist: Lyn Hollingshead, MD ? ? ? ? ?

## 2021-12-27 NOTE — Transfer of Care (Signed)
Immediate Anesthesia Transfer of Care Note ? ?Patient: Kathy Dickerson ? ?Procedure(s) Performed: BILATERAL TOTAL MASTECTOMY (Bilateral: Breast) ?BILATERAL BREAST CLOSURE AFTER MASTECTOMY (Bilateral: Breast) ? ?Patient Location: PACU ? ?Anesthesia Type:GA combined with regional for post-op pain ? ?Level of Consciousness: drowsy ? ?Airway & Oxygen Therapy: Patient Spontanous Breathing and Patient connected to face mask oxygen ? ?Post-op Assessment: Report given to RN and Post -op Vital signs reviewed and stable ? ?Post vital signs: Reviewed and stable ? ?Last Vitals:  ?Vitals Value Taken Time  ?BP 108/58 12/27/21 1258  ?Temp 36.4 ?C 12/27/21 1258  ?Pulse 80 12/27/21 1258  ?Resp 16 12/27/21 1258  ?SpO2 99 % 12/27/21 1258  ?Vitals shown include unvalidated device data. ? ?Last Pain:  ?Vitals:  ? 12/27/21 1031  ?TempSrc: Oral  ?PainSc: 0-No pain  ?   ? ?  ? ?Complications: No notable events documented. ?

## 2021-12-27 NOTE — Op Note (Signed)
BILATERAL TOTAL MASTECTOMY  Procedure Note ? ?Kathy Dickerson ?12/27/2021 ? ? 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    ?Post-op Diagnosis: same ? ?Procedure(s): ?BILATERAL TOTAL MASTECTOMY ?INJECTION OF MAGTRACE IN RIGHT BREAST FOR LYMPH NODE MAPPING ? ? ?Surgeon:  Coralie Keens, MD ? ?Assist: Malachi Pro, PA ? ?Anesthesia: General ? ?Staff:  ?Circulator: Ted Mcalpine, RN ?Relief Circulator: Izora Ribas, RN ?Relief Scrub: Albina Billet, CST ?Scrub Person: Izora Ribas, RN ? ?Estimated Blood Loss: Minimal ?              ?Specimens: sent to path ? 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  The decision has been made to proceed with bilateral total mastectomies followed by closure of the wounds by plastic surgery.  Because of the multifocal high-grade nature of her DCIS, mag trace will be injected underneath the right nipple areolar complex to map the lymph nodes if final pathology shows any invasive cancer ? ?Procedure: The patient was brought to operating identifies correct patient.  She is placed upon the operating table general anesthesia was induced.  I injected mag trace underneath the right nipple areolar complex and massaged the breast under sterile technique.  Her bilateral breast and axilla were then prepped and draped in usual sterile fashion.  Dr. Marla Roe marked both breasts with a marking pen outlining the areas of the mastectomies.  I then made incision through her marks on the right breast with a scalpel incorporating the nipple areolar complex on the right.  I then took this down circumferentially to the breast tissue with electrocautery.  With electrocautery I then dissected out the superior skin flap staying underneath the skin and dermis going inferiorly toward the clavicle and down to the chest wall.  I then  dissected the inferior skin flap with electrocautery staying underneath the skin and dermis going down to the level of the inframammary ridge.  I then dissected the breast tissue off of the chest wall and pectoralis fashion dissecting medial to lateral with electrocautery.  Once I reached the lateral edge of the pectoralis muscle and axilla I then completed the total mastectomy.  I marked the specimen laterally with a silk suture.  The left lumpectomy was sent to pathology for evaluation ?I next performed the elliptical incision with a corresponding marks placed by plastic surgery with a scalpel incorporating the right nipple areolar complex.  I then dissected down to the breast tissue with electrocautery.  I again dissected underneath the skin of the superior flap going toward the clavicle and then down toward the chest wall with the cautery.  I then dissected the inferior skin flap staying on underneath the skin and dermis going down toward the inframammary ridge and then on the chest wall as well.  I then slowly dissected the breast tissue off of the pectoralis muscle and fascia with electrocautery dissecting medial to lateral.  Again once I reached the lateral extent of the breast and the pectoralis muscle and axilla I completed the total mastectomy with the cautery.  I then marked the lateral extent with a silk suture.  The right mastectomy was then sent to pathology for evaluation as well.  Hemostasis at both mastectomy sites appear to be achieved.  At this point plastic surgery began there portion of the procedure with closure of the wound and drain placement.  The patient was tolerated procedure well. ? ? ?        ? ?Coralie Keens  ? ?Date: 12/27/2021  Time: 12:10 PM ? ? ? ?

## 2021-12-27 NOTE — Progress Notes (Signed)
Assisted Dr. Hatchett with left, right, pectoralis, ultrasound guided block. Side rails up, monitors on throughout procedure. See vital signs in flow sheet. Tolerated Procedure well. 

## 2021-12-28 ENCOUNTER — Encounter (HOSPITAL_BASED_OUTPATIENT_CLINIC_OR_DEPARTMENT_OTHER): Payer: Self-pay | Admitting: Surgery

## 2021-12-28 DIAGNOSIS — D0512 Intraductal carcinoma in situ of left breast: Secondary | ICD-10-CM | POA: Diagnosis not present

## 2021-12-28 NOTE — Discharge Summary (Signed)
Physician Discharge Summary  Patient ID: Kathy Dickerson MRN: [id number] DOB/AGE: June 25, 1956 66 y.o.  Admit date: 12/27/2021 Discharge date: 12/28/2021  Admission Diagnoses:  Discharge Diagnoses:  Principal Problem:   Breast cancer [facility])   Discharged Condition: good  Hospital Course: The patient was taken to the OR with Dr. Ninfa Linden and underwent bilateral mastectomies.  She then had an aesthetic closure.  She was managed in the postoperative recovery ward and did well throughout the night.  She was tolerating food and pain was controlled.  She was ready for discharge.  Consults: None  Significant Diagnostic Studies: none  Treatments: surgery: bilateral mastectomies  Discharge Exam: Blood pressure 123/63, pulse 71, temperature 98.2 F (36.8 C), resp. rate 16, height '5\' 3"'$  (1.6 m), weight 73.3 kg, SpO2 95 %. General appearance: alert, cooperative, and no distress Chest wall: tender as expected.   Incision/Wound:Drains working  Disposition: Discharge disposition: 01-Home or Self Care       Discharge Instructions     Call MD for:  difficulty breathing, headache or visual disturbances   Complete by: As directed    Call MD for:  persistant dizziness or light-headedness   Complete by: As directed    Call MD for:  persistant nausea and vomiting   Complete by: As directed    Call MD for:  redness, tenderness, or signs of infection (pain, swelling, redness, odor or green/yellow discharge around incision site)   Complete by: As directed    Call MD for:  severe uncontrolled pain   Complete by: As directed    Diet general   Complete by: As directed    Increase activity slowly   Complete by: As directed    Lifting restrictions   Complete by: As directed    No heavy lifting      Allergies as of 12/28/2021       Reactions   Erythromycin Nausea And Vomiting        Medication List     TAKE these medications    calcium-vitamin D 500-5 MG-MCG tablet Commonly  known as: OSCAL WITH D Take 1 tablet by mouth.   Fish Oil 1000 MG Caps 1 capsule   fluticasone 50 MCG/ACT nasal spray Commonly known as: FLONASE SMARTSIG:2 Spray(s) Both Nares Daily PRN   loratadine 10 MG tablet Commonly known as: CLARITIN Take 10 mg by mouth daily.   losartan-hydrochlorothiazide 100-25 MG tablet Commonly known as: HYZAAR Take 1 tablet by mouth daily.   magnesium citrate Soln Take 1 Bottle by mouth once.   MUCINEX ALLERGY PO Take 1 Dose by mouth as needed. Liquid mucinex cold and flu as needed   multivitamin with iron-minerals liquid Take by mouth daily.   ondansetron 4 MG disintegrating tablet Commonly known as: ZOFRAN-ODT Take 1 tablet (4 mg total) by mouth every 8 (eight) hours as needed for nausea or vomiting.   Potassium Gluconate 2 MEQ Tabs Take by mouth.   PROTONIX PO Take 15 mg by mouth 2 (two) times a week. Every 3rd day   Turmeric 500 MG Caps Take by mouth.        Follow-up Information     Kathy Dickerson, Loel Lofty, DO Follow up in 10 day(s).   Specialty: Plastic Surgery Contact information: [place] [address] [phone]                 Signed: Wallace Dickerson 12/28/2021, 7:34 AM

## 2022-01-01 NOTE — Progress Notes (Unsigned)
Patient is a 66 year old female with history of right side DCIS.  Patient underwent immediate oncoplastic breast closure with tissue rearrangement with Dr. Marla Roe on 12/27/2021.  Patient was discharged from the surgery center the next morning in good condition.  Today the patient is 6 days postop.  She is here for a postop follow-up

## 2022-01-02 ENCOUNTER — Ambulatory Visit (INDEPENDENT_AMBULATORY_CARE_PROVIDER_SITE_OTHER): Payer: Medicare Other | Admitting: Student

## 2022-01-02 DIAGNOSIS — D0511 Intraductal carcinoma in situ of right breast: Secondary | ICD-10-CM

## 2022-01-02 LAB — SURGICAL PATHOLOGY

## 2022-01-02 NOTE — Progress Notes (Signed)
Patient Care Team: Masneri, Adele Barthel, DO as PCP - General (Family Medicine) Rockwell Germany, RN as Oncology Nurse Navigator Mauro Kaufmann, RN as Oncology Nurse Navigator Coralie Keens, MD as Consulting Physician (General Surgery) Nicholas Lose, MD as Consulting Physician (Hematology and Oncology) Kyung Rudd, MD as Consulting Physician (Radiation Oncology)  DIAGNOSIS:  Encounter Diagnosis  Name Primary?   Ductal carcinoma in situ (DCIS) of right breast     SUMMARY OF ONCOLOGIC HISTORY: Oncology History  Ductal carcinoma in situ (DCIS) of right breast  10/05/2021 Initial Diagnosis   Ductal carcinoma in situ (DCIS) of right breast    10/11/2021 Cancer Staging   Staging form: Breast, AJCC 8th Edition - Clinical stage from 10/11/2021: Stage 0 (cTis (DCIS), cN0, cM0, ER-, PR-, HER2: Not Assessed) - Signed by Nicholas Lose, MD on 10/11/2021 Stage prefix: Initial diagnosis Nuclear grade: G3 Laterality: Right Staged by: Pathologist and managing physician Stage used in treatment planning: Yes National guidelines used in treatment planning: Yes    10/20/2021 Genetic Testing   Negative hereditary cancer genetic testing: no pathogenic variants detected in [facility] BRCAPlus Panel and CancerNext-Expanded +RNAinsight Panel.  Report dates are October 19, 2021 and October 22, 2021.   The BRCAplus panel offered by [employer] and includes sequencing and deletion/duplication analysis for the following 8 genes: ATM, BRCA1, BRCA2, CDH1, CHEK2, PALB2, PTEN, and TP53.  The CancerNext-Expanded gene panel offered by [facility] and includes sequencing, rearrangement, and RNA analysis for the following 77 genes: AIP, ALK, APC, ATM, AXIN2, BAP1, BARD1, BLM, BMPR1A, BRCA1, BRCA2, BRIP1, CDC73, CDH1, CDK4, CDKN1B, CDKN2A, CHEK2, CTNNA1, DICER1, FANCC, FH, FLCN, GALNT12, KIF1B, LZTR1, MAX, MEN1, MET, MLH1, MSH2, MSH3, MSH6, MUTYH, NBN, NF1, NF2, NTHL1, PALB2, PHOX2B, PMS2, POT1, PRKAR1A, PTCH1, PTEN, RAD51C,  RAD51D, RB1, RECQL, RET, SDHA, SDHAF2, SDHB, SDHC, SDHD, SMAD4, SMARCA4, SMARCB1, SMARCE1, STK11, SUFU, TMEM127, TP53, TSC1, TSC2, VHL and XRCC2 (sequencing and deletion/duplication); EGFR, EGLN1, HOXB13, KIT, MITF, PDGFRA, POLD1, and POLE (sequencing only); EPCAM and GREM1 (deletion/duplication only).    12/27/2021 Surgery   Bilateral mastectomies Left mastectomy: Focal high-grade DCIS, ALH, ER 100%, PR 20% Right mastectomy: High-grade DCIS with necrosis, 0/1 intramammary lymph node     CHIEF COMPLIANT: Follow-up after surgery  INTERVAL HISTORY: Kathy Dickerson is a 66 y.o. female is here because of recent diagnosis of DCIS the right breast. She presents to the clinic today for a follow-up after surgery. She states that surgery went well.  She is healing and recovering very well from the surgery.  She is on some discomfort from the surgical scars.   ALLERGIES:  is allergic to erythromycin and sulfamethoxazole-trimethoprim.  MEDICATIONS:  Current Outpatient Medications  Medication Sig Dispense Refill   calcium-vitamin D (OSCAL WITH D) 500-5 MG-MCG tablet Take 1 tablet by mouth.     Fexofenadine HCl (MUCINEX ALLERGY PO) Take 1 Dose by mouth as needed. Liquid mucinex cold and flu as needed     fluticasone (FLONASE) 50 MCG/ACT nasal spray SMARTSIG:2 Spray(s) Both Nares Daily PRN     loratadine (CLARITIN) 10 MG tablet Take 10 mg by mouth daily.     losartan-hydrochlorothiazide (HYZAAR) 100-25 MG tablet Take 1 tablet by mouth daily.     magnesium citrate SOLN Take 1 Bottle by mouth once.     Multiple Vitamins-Minerals (MULTIVITAMIN WITH IRON-MINERALS) liquid Take by mouth daily.     Omega-3 Fatty Acids (FISH OIL) 1000 MG CAPS 1 capsule     ondansetron (ZOFRAN-ODT) 4 MG disintegrating tablet  Take 1 tablet (4 mg total) by mouth every 8 (eight) hours as needed for nausea or vomiting. 20 tablet 0   Pantoprazole Sodium (PROTONIX PO) Take 15 mg by mouth 2 (two) times a week. Every 3rd day      Potassium Gluconate 2 MEQ TABS Take by mouth.     Turmeric 500 MG CAPS Take by mouth.     No current facility-administered medications for this visit.    PHYSICAL EXAMINATION: ECOG PERFORMANCE STATUS: 1 - Symptomatic but completely ambulatory  Vitals:   01/09/22 0905  BP: (!) 187/83  Pulse: 98  Resp: 18  Temp: (!) 97.3 F (36.3 C)  SpO2: 100%   Filed Weights   01/09/22 0905  Weight: 165 lb 4.8 oz (75 kg)      LABORATORY DATA:  I have reviewed the data as listed    Latest Ref Rng & Units 12/25/2021   10:35 AM 10/11/2021    8:38 AM 08/23/2008    3:30 AM  CMP  Glucose 70 - 99 mg/dL 113   115   149    BUN 8 - 23 mg/dL 15   19   5     Creatinine 0.44 - 1.00 mg/dL 0.88   0.79   0.70    Sodium 135 - 145 mmol/L 134   141   138    Potassium 3.5 - 5.1 mmol/L 3.3   3.4   4.2 REPEATED TO VERIFY DELTA CHECK NOTED    Chloride 98 - 111 mmol/L 95   105   109    CO2 22 - 32 mmol/L 27   27   22     Calcium 8.9 - 10.3 mg/dL 9.5   10.5   7.4    Total Protein 6.5 - 8.1 g/dL  7.6   5.3    Total Bilirubin 0.3 - 1.2 mg/dL  0.8   0.6    Alkaline Phos 38 - 126 U/L  58   68    AST 15 - 41 U/L  17   24    ALT 0 - 44 U/L  15   19      Lab Results  Component Value Date   WBC 6.9 10/11/2021   HGB 12.5 10/11/2021   HCT 37.4 10/11/2021   MCV 93.7 10/11/2021   PLT 251 10/11/2021   NEUTROABS 4.9 10/11/2021    ASSESSMENT & PLAN:  Ductal carcinoma in situ (DCIS) of right breast 12/27/21: Bilateral mastectomies Left mastectomy: Focal high-grade DCIS, ALH, ER 100%, PR 20% Right mastectomy: High-grade DCIS with necrosis, 0/1 intramammary lymph node  Pathology counseling: I discussed the final pathology report of the patient provided  a copy of this report. I discussed the margins.  We also discussed the final staging along with previously performed ER/PR testing.  Treatment plan: Surveillance No need of antiestrogen therapy with history of bilateral mastectomies. No need of imaging studies since  she had bilateral mastectomies. She can be seen on an as-needed basis.    No orders of the defined types were placed in this encounter.  The patient has a good understanding of the overall plan. she agrees with it. she will call with any problems that may develop before the next visit here. Total time spent: 30 mins including face to face time and time spent for planning, charting and co-ordination of care   Harriette Ohara, MD 01/09/22    I Gardiner Coins am scribing for Dr. Lindi Adie  I have reviewed the above  documentation for accuracy and completeness, and I agree with the above.

## 2022-01-03 ENCOUNTER — Encounter: Payer: Self-pay | Admitting: *Deleted

## 2022-01-09 ENCOUNTER — Ambulatory Visit (INDEPENDENT_AMBULATORY_CARE_PROVIDER_SITE_OTHER): Payer: Medicare Other | Admitting: Student

## 2022-01-09 ENCOUNTER — Other Ambulatory Visit: Payer: Self-pay

## 2022-01-09 ENCOUNTER — Inpatient Hospital Stay: Payer: Medicare Other | Attending: Hematology and Oncology | Admitting: Hematology and Oncology

## 2022-01-09 DIAGNOSIS — D0511 Intraductal carcinoma in situ of right breast: Secondary | ICD-10-CM | POA: Insufficient documentation

## 2022-01-09 MED ORDER — OXYCODONE HCL 5 MG/5ML PO SOLN: 5.0000 mg | Freq: Four times a day (QID) | ORAL | 0 refills | Status: AC | PRN

## 2022-01-09 NOTE — Assessment & Plan Note (Addendum)
12/27/21: Bilateral mastectomies Left mastectomy: Focal high-grade DCIS, ALH, ER 100%, PR 20% Right mastectomy: High-grade DCIS with necrosis, 0/1 intramammary lymph node  Pathology counseling: I discussed the final pathology report of the patient provided  a copy of this report. I discussed the margins.  We also discussed the final staging along with previously performed ER/PR testing.  Treatment plan: Surveillance No need of antiestrogen therapy with history of bilateral mastectomies. No need of imaging studies since she had bilateral mastectomies. She can be seen on an as-needed basis.

## 2022-01-09 NOTE — Progress Notes (Signed)
Patient is a 66 year old female with past medical history including right-sided DCIS.  Patient underwent immediate bilateral mastectomy with Dr. Ninfa Linden with oncoplastic breast closure with tissue rearrangement with Dr. Marla Roe on 12/27/2021.  Patient was seen in the clinic on 01/02/2022.  At this visit, she was doing well but complained of some burning sensation on her right breast and bruising of her left breast and abdomen.  On exam there was bruising noted to the left superior abdomen and left breast.  Both drains were left in place.  Patient is here in the clinic today for follow-up.  Today, patient reports she is doing alright.  She reports she is still having episodes of burning sensation in her right breast.  Patient reports she has been putting a heating pad over the area which has been significantly helpful for this pain. She also states she has been taking ibuprofen and tylenol which has been helping as well. She denies any fevers, chills, nausea or vomiting. She reports bruising has improved on her left side. Patient states she has had approximately 40-50 cc per 24 hour period from the left drain and 10-15 cc per 24 hour period from the right drain. Patient reports she has been taking benadryl for sleeping which she has found helpful.   Chaperone present on exam. On exam, patient is sitting upright in no acute distress. Breasts are soft and fairly symmetrical. Incisions to breasts are intact bilaterally with steri-strips.  There is no erythema or drainage surrounding the incisions.  Small fluid collection palpated on right breast.  Minimal bruising to right breast.  No erythema to right breast.  Bruising noted to left breast and left superior abdomen.  Drain on the right side is in place and functional.  It has approximately 5 cc of serous fluid in the bulb.  Drain on the left side is in place and functional.  He has approximately 10 cc of dark serosanguineous fluid in the bulb.  Discussed with  patient that we will leave the drains in place for now and evaluate at her next visit if they can be removed. Discussed with patient that she may continue with the heating pad and use a low setting but to use caution as she at risk of burning herself. She states she does not use the heating pad directly on her skin and acknowledged she needs to be cautious.   Also discussed that I will send her a few more days of pain medications given her pain. I cautioned the use of the narcotics and benadryl as they will cause drowsiness. Patient stated she was aware and has been caution while using these medications.   Instructed patient to continue compressive garments. Discussed to continue to ambulate but restrict vigorous activities and to not lift greater than 10-15 pounds. Told patient she may shower and run soapy water over the incision sites. Also told her to leave the steri-strips in place.   Patient to follow up next week.   I discussed the objective findings and plan with Dr. Marla Roe

## 2022-01-10 ENCOUNTER — Encounter: Payer: Self-pay | Admitting: *Deleted

## 2022-01-10 DIAGNOSIS — D0511 Intraductal carcinoma in situ of right breast: Secondary | ICD-10-CM

## 2022-01-15 NOTE — Progress Notes (Unsigned)
Patient is a 66 year old female with history of right-sided DCIS.  Patient underwent immediate bilateral mastectomy with Dr. Ninfa Linden with oncoplastic breast closure with tissue rearrangement Dr. Marla Roe on 12/27/2021.  Patient was last seen in the clinic on 01/09/2022.  This visit reported she was doing all right but still had some the right-sided burning sensation.  She stated at this visit that using a heating pad over this area really helped.  Patient also reported drains were putting out completely 10 cc/day on the right and 40 to 50 cc/day on the left.  On exam, the left breast and superior left abdomen were still bruised but improved from the previous visit.  There was a small fluid collection on the right breast.  Otherwise patient was doing well.  Today the patient is here for postoperative follow-up.

## 2022-01-16 ENCOUNTER — Ambulatory Visit (INDEPENDENT_AMBULATORY_CARE_PROVIDER_SITE_OTHER): Payer: Medicare Other | Admitting: Student

## 2022-01-16 DIAGNOSIS — D0511 Intraductal carcinoma in situ of right breast: Secondary | ICD-10-CM

## 2022-01-19 ENCOUNTER — Ambulatory Visit (INDEPENDENT_AMBULATORY_CARE_PROVIDER_SITE_OTHER): Payer: Medicare Other | Admitting: Student

## 2022-01-19 DIAGNOSIS — D0511 Intraductal carcinoma in situ of right breast: Secondary | ICD-10-CM

## 2022-01-19 NOTE — Progress Notes (Signed)
Patient is a 66 year old female with past history of breast cancer.  Patient underwent bilateral mastectomies with Dr. Ninfa Linden and oncoplastic breast closure with tissue rearrangement with Dr. Marla Roe on 12/27/2021.  Patient was last seen in the clinic on 01/16/2022.  At this visit patient was doing well and feeling better.  On exam, incisions were intact.  Ecchymosis to left breast was improved from the previous week.  There were no signs of infection.  She had both of her drains still in place.  The right drain was removed.  Planned to have patient follow-up today to assess if left drain could be removed.  Today, patient is here for further follow-up and possible drain removal.  Patient is doing well.  Patient denies any new concerns since being seen in the clinic on Tuesday.  She reports the drain on the left side has been putting less than 10 cc/day since her last visit.  Chaperone present on exam.  On exam, patient is sitting upright in no acute distress.  The breasts are fairly symmetrical and the breast tissue is soft bilaterally.  Incisions are intact with Steri-Strips bilaterally.  There is no erythema or drainage from the incision sites.  There is no signs of infection.  No significant fluid collections palpated.  The previous drain site on the right side is healing well with no signs of infection.  The left breast and left superior abdomen still have some ecchymosis, but is similar and somewhat improved from previous exam.  The left drain is in place and functioning.  There is approximately 5 cc of serosanguineous drainage in the bulb.  The small 2 x 2 centimeter patch to patient's left proximal forearm has decreased in size to approximately 1 x 1 cm and has faded in color.  The left drain was removed today at the visit.  Patient tolerated well.  Vaseline and gauze were put over the drain site.  Discussed with patient that she may put Vaseline and gauze over the drain sites daily and as needed.   Discussed with patient to continue compressive bra.  Discussed with patient to continue to sleep on her back and avoid sleeping on her side.  Discussed with patient the patch on her arm appears to be improving and that she can continue to monitor.  Patient reports she does have an appointment with her dermatologist.  Told patient to call with any questions or concerns she may call us.   Patient to follow-up in the clinic in 2 weeks.

## 2022-01-26 ENCOUNTER — Telehealth: Payer: Self-pay

## 2022-01-26 ENCOUNTER — Telehealth: Payer: Self-pay | Admitting: *Deleted

## 2022-01-26 NOTE — Telephone Encounter (Signed)
error 

## 2022-01-26 NOTE — Telephone Encounter (Signed)
Pt returned call to [facility] and declined offer for breast cancer survivorship surveillance. Pt states she sees a PCP who manages her care post-BRCA. She will f/u on PRN basis.

## 2022-01-26 NOTE — Telephone Encounter (Signed)
Attempted to call pt to offer appt with our survivorship program. LVM for call back.

## 2022-01-30 ENCOUNTER — Institutional Professional Consult (permissible substitution): Payer: Medicare Other | Admitting: Plastic Surgery

## 2022-02-05 NOTE — Progress Notes (Signed)
Patient is a 66 year old female with past medical history including breast cancer. Patient underwent bilateral mastectomies with Dr. Ninfa Linden and oncoplastic breast closure with tissue rearrangement with Dr. Marla Roe on 12/27/21. Patient presents today for postoperative follow up.  Patient was last seen in the clinic on 01/19/22. At this visit, patient was doing very well. She had no new complaints. On exam, incisions were intact. There was some ecchymosis to the left breast but improved from previous exams. The left drain was removed at this visit.   Today, patient reports she is doing well. She reports some mild aching pain intermittently in her axilla. She also reports some dryness and occasional itchiness to superior chest and axilla. She denies any other concerns. She denies fevers or chills. She denies issues at the surgical sites.   Chaperone present on exam.  On exam, patient is sitting upright in no acute distress. Her breasts are soft bilaterally and fairly symmetrical. The left breast has slightly more swelling than the right. Left breast with minimal bruising, improved from previous exam. Steri strips in place on the incisions. Steri strips removed. Incisions intact and healing well. Small area of irritation to the lateral aspect of the right breast incision. No drainage or cellulitic changes to either breast. No redness or irritation noted to skin to superior chest and axilla.   Discussed with patient she may gradually increase her activity. Discussed she may shower and allow soapy water over the incision sites. Discussed with patient she should apply vaseline to incision sites daily and to the area of irritation to the right incision site. Patient acknowledged.  Discussed with patient she should continue compressive sports bra.   Patient to follow up in 2 weeks.   Pictures were obtained and placed in the patient's chart with the patient's permission.

## 2022-02-06 ENCOUNTER — Ambulatory Visit (INDEPENDENT_AMBULATORY_CARE_PROVIDER_SITE_OTHER): Payer: Medicare Other | Admitting: Student

## 2022-02-06 DIAGNOSIS — D0511 Intraductal carcinoma in situ of right breast: Secondary | ICD-10-CM

## 2022-02-09 ENCOUNTER — Ambulatory Visit: Payer: Medicare Other | Admitting: Student

## 2022-02-20 ENCOUNTER — Ambulatory Visit: Payer: Medicare Other | Admitting: Student

## 2022-02-22 NOTE — Progress Notes (Deleted)
Patient is a 66 year old female with past medical history including breast cancer.  Patient underwent bilateral mastectomies with Dr. Ninfa Linden and oncoplastic breast closure with tissue rearrangement with Dr. Marla Roe on 12/27/2021.  Patient presents for postoperative follow-up.  Patient was last seen in the clinic on 02/06/2022.  At this visit, patient reported she was doing well.  She did report some mild aching pains and some mild itchiness.  Otherwise she denied any issues.  On exam, patient appears to be healing well.  There were no signs of infection.  Bruising to the left breast was improving.  Recommended patient apply Vaseline to her incisions daily and to continue her compressive sports bra.  Today,

## 2022-02-23 ENCOUNTER — Ambulatory Visit: Payer: Medicare Other | Admitting: Student

## 2022-02-27 ENCOUNTER — Ambulatory Visit (INDEPENDENT_AMBULATORY_CARE_PROVIDER_SITE_OTHER): Payer: Medicare Other | Admitting: Student

## 2022-02-27 ENCOUNTER — Encounter: Payer: Self-pay | Admitting: Student

## 2022-02-27 DIAGNOSIS — D0511 Intraductal carcinoma in situ of right breast: Secondary | ICD-10-CM

## 2022-02-27 NOTE — Progress Notes (Signed)
Patient is a 66 year old female with past medical history including breast cancer.  Patient has known bilateral mastectomies with Dr. Ninfa Linden and oncoplastic breast closure with tissue rearrangement with Dr. Marla Roe on 12/27/2021.  Patient presents today for postoperative follow-up.  Patient was last seen in the clinic on 02/06/2022.  At that visit, patient is doing well.  She did report some mild aching pain intermittently to her axilla.  She also reported some dryness and occasional itchiness to her superior chest and axilla.  On exam, incisions are intact and healing well.  There was some mild swelling noted to the left breast.  Plan was for patient to follow-up in 2 weeks.  Today, patient reports she is doing well.  She states she has some swelling on the lateral aspects of her chest wall which has been present since surgery.  She states that sometimes she has some pain there.  Patient also reports she has some residual Dermabond over the incision sites.  Patient denies any other issues at the surgical sites.  She denies any fevers or chills.  Chaperone present on exam.  On exam, patient is sitting upright in no acute distress.  Incision sites are intact and healing well.  There is some residual Dermabond surrounding the incision sites.  Some of this was removed at today's visit.  There is no erythema or drainage from the incisions.  There is some mild swelling to the lateral aspects of the chest wall.  There is no significant fluid collections palpated on exam though.  Bruising to the left breast has greatly improved.  I discussed with the patient that she should continue compression sports bra for the next few weeks.  I also discussed with the patient that she can continue Vaseline over the Dermabond to help slowly remove it off.  I also discussed with the patient she can try gently washing the area in the shower with soap.  Discussed with the patient that she may start using scar creams such as  skinuva or Mederma whenever all the Dermabond is off.  Discussed with the patient to follow-up with [place] if she still bothered by the lateral chest wall areas bilaterally with [place] in a month or so.  Patient acknowledged.  Instructed patient to call us if she has any questions or concerns.  Pictures were obtained with the patient and placed in the patient's chart with the patient's permission.  Patient to follow-up as needed.  Dr. Marla Roe also examined the patient today.

## 2022-06-21 ENCOUNTER — Telehealth: Payer: Self-pay | Admitting: *Deleted

## 2022-06-21 NOTE — Telephone Encounter (Signed)
Received on (06/13/22) via of fax DME Standard Written Order from [facility].  Requesting signature and return.  Given to provider to sign.  DME Standard Written Order signed and faxed back to [facility] [place].  Confirmation received and copy scanned into the chart.//AB/CMA

## 2023-01-18 NOTE — Progress Notes (Signed)
 PROVIDER:  VICENTA DASIE POLI, MD  MRN: [id number] DOB: May 27, 1956 DATE OF ENCOUNTER: 01/18/2023 Subjective     Chief Complaint: Follow-up (1 YR LTFU breast)     History of Present Illness: Kathy Dickerson is a 67 y.o. 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  Her DCIS was ER/PR negative so she decided to proceed with bilateral mastectomies.  She reports she is doing well and has had no real medical changes over the past year.     Review of Systems: A complete review of systems was obtained from the patient.  I have reviewed this information and discussed as appropriate with the patient.  See HPI as well for other ROS.  ROS    Medical History: Past Medical History:  Diagnosis Date  . Arthritis   . GERD (gastroesophageal reflux disease)   . Hyperlipidemia   . Hypertension     Patient Active Problem List  Diagnosis  . Ductal carcinoma in situ (DCIS) of right breast  . Chronic gouty arthritis  . Dyslipidemia  . Family history of breast cancer  . Gastroesophageal reflux disease without esophagitis  . Genetic testing  . Hypertension  . Seasonal allergic rhinitis    Past Surgical History:  Procedure Laterality Date  . CYSTECTOMY N/A    1974 or 1975  . LAPAROSCOPIC CHOLECYSTECTOMY N/A    2003 or 2004     Allergies  Allergen Reactions  . Erythromycin Nausea And Vomiting  . Sulfamethoxazole-Trimethoprim Dizziness    Current Outpatient Medications on File Prior to Visit  Medication Sig Dispense Refill  . calcium carbonate-vitamin D3 (OS-CAL 500+D) 500 mg-5 mcg (200 unit) tablet Take 1 tablet by mouth    . magnesium citrate oral solution Take by mouth    . multivitamin tablet Take 1 tablet by mouth once daily    . turmeric root extract 500 mg Cap Take by mouth    . gabapentin (NEURONTIN) 300 MG capsule Take 300 mg by mouth at bedtime (Patient not taking: Reported on 01/18/2023)    .  losartan (COZAAR) 100 MG tablet Take 100 mg by mouth once daily (Patient not taking: Reported on 01/18/2023)     No current facility-administered medications on file prior to visit.    Family History  Problem Relation Age of Onset  . High blood pressure (Hypertension) Mother      Social History   Tobacco Use  Smoking Status Former  . Current packs/day: 0.00  . Types: Cigarettes  . Quit date: 43  . Years since quitting: 41.4  Smokeless Tobacco Never     Social History   Socioeconomic History  . Marital status: Married  Tobacco Use  . Smoking status: Former    Current packs/day: 0.00    Types: Cigarettes    Quit date: 1983    Years since quitting: 41.4  . Smokeless tobacco: Never  Vaping Use  . Vaping status: Never Used  Substance and Sexual Activity  . Alcohol use: Never  . Drug use: Never   Social Determinants of Health   Food Insecurity: No Food Insecurity (10/11/2021)   Received from [facility] [place]   Hunger Vital Sign   . Worried About Programme researcher, broadcasting/film/video in the Last Year: Never true   . Ran Out of Food in the Last Year: Never true  Transportation Needs: No Transportation Needs (10/11/2021)   Received from [facility]   .  Lack of Transportation (Medical): No   . Lack of Transportation (Non-Medical): No    Objective:    Vitals:   01/18/23 1142  Weight: 80.7 kg (178 lb)  Height: 160 cm (5' 3)    Body mass index is 31.53 kg/m.  Physical Exam   Her mastectomy sites are well-healed.  I feel no palpable chest wall masses and there is no axillary adenopathy on either side   Labs, Imaging and Diagnostic Testing:  I reviewed her notes in the electronic medical records   Assessment and Plan:     Diagnoses and all orders for this visit:  Neoplasm of right breast, primary tumor staging category Tis: ductal carcinoma in situ (DCIS)     She is now 1 year status post bilateral mastectomies.  She still needs to do self  examinations for the very small risk of a chest wall recurrence.  I will plan on seeing her back in 1 year for reevaluation.  At any point should she be concerned about any abnormalities on the chest wall, I encouraged to go ahead and call and come back and see me for an examination.  She agrees with the plans.  Return in about 1 year (around 01/18/2024).   VICENTA DASIE POLI, MD

## 2023-05-10 IMAGING — MG MM BREAST BX W/ LOC DEV EA AD LESION IMAG BX SPEC STEREO GUIDE*R
6 of 7 series · 6 of 35 positions shown · non-contrast
Comparison: Previous exams.
COMPARISON: Previous exams.

Addendum:
CLINICAL DATA: History of biopsy-proven DCIS within the upper inner
quadrant of the RIGHT breast.

[R CC tomo (1 of 6) · tomo slice 24/47.0]
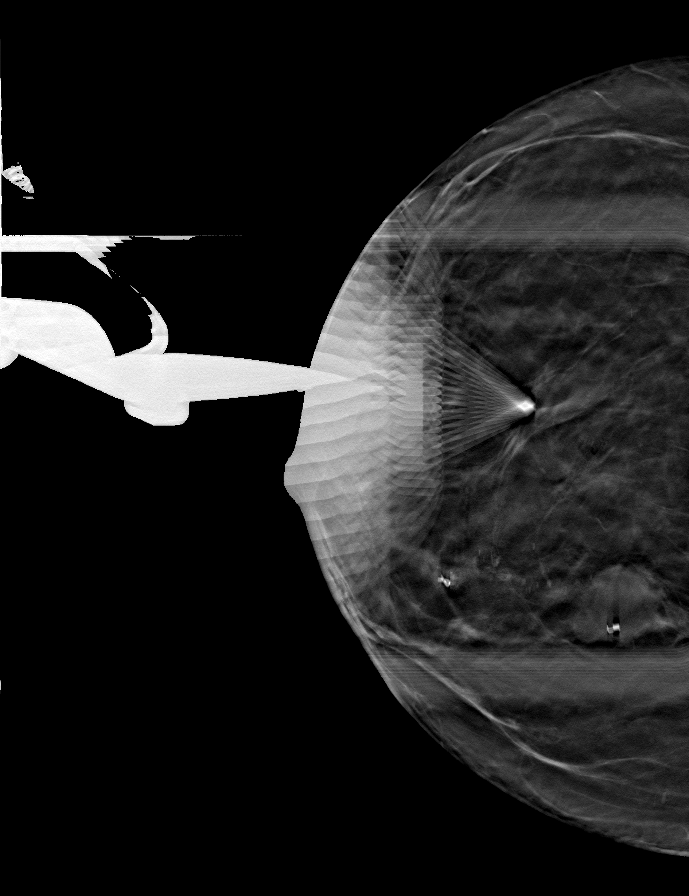

[R CC tomo (2 of 6) · tomo slice 21/41.0]
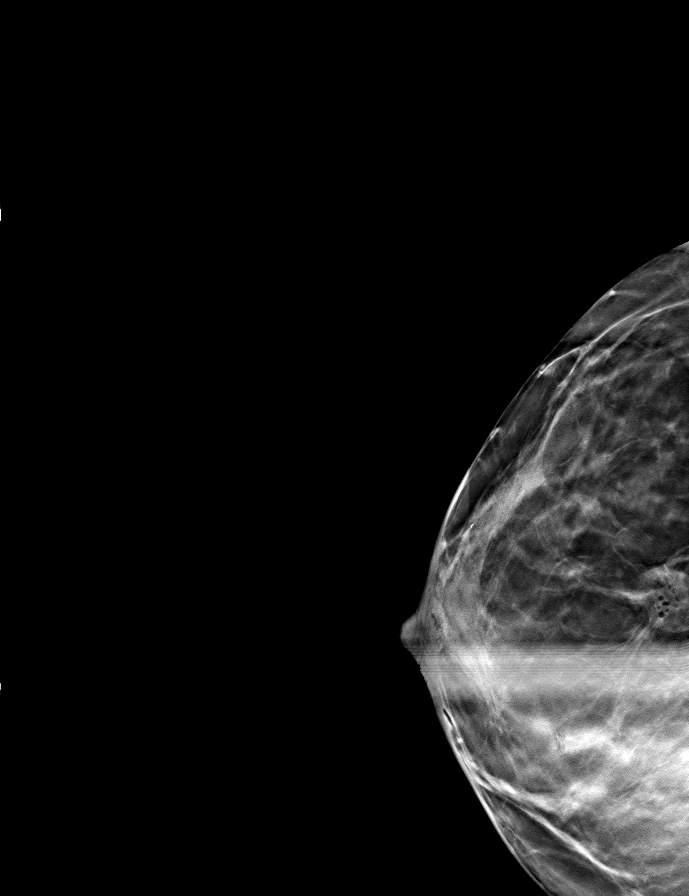

[R CC tomo (3 of 6) · tomo slice 24/47.0]
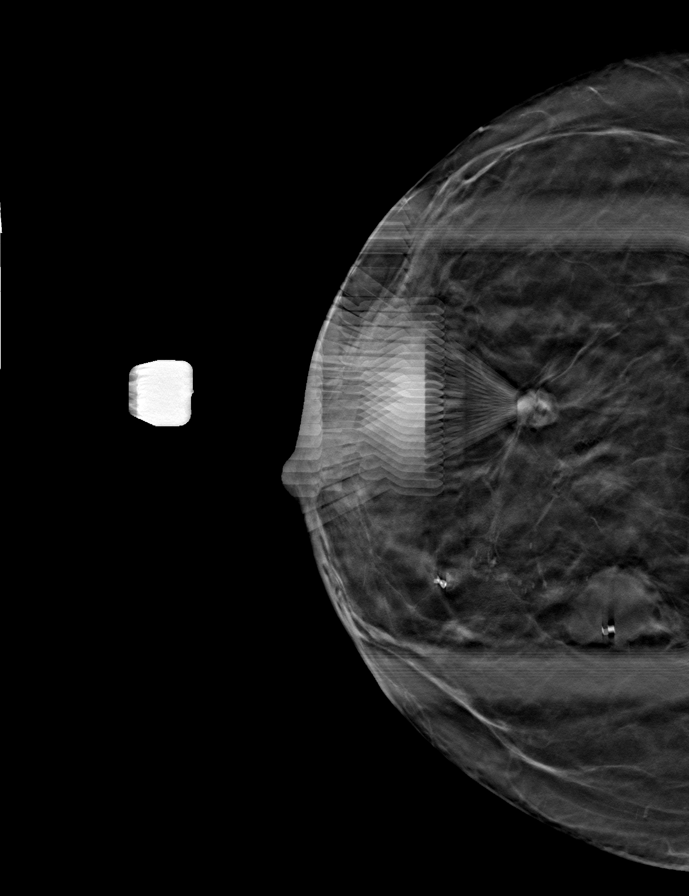

[R CC tomo (4 of 6) · tomo slice 24/47.0]
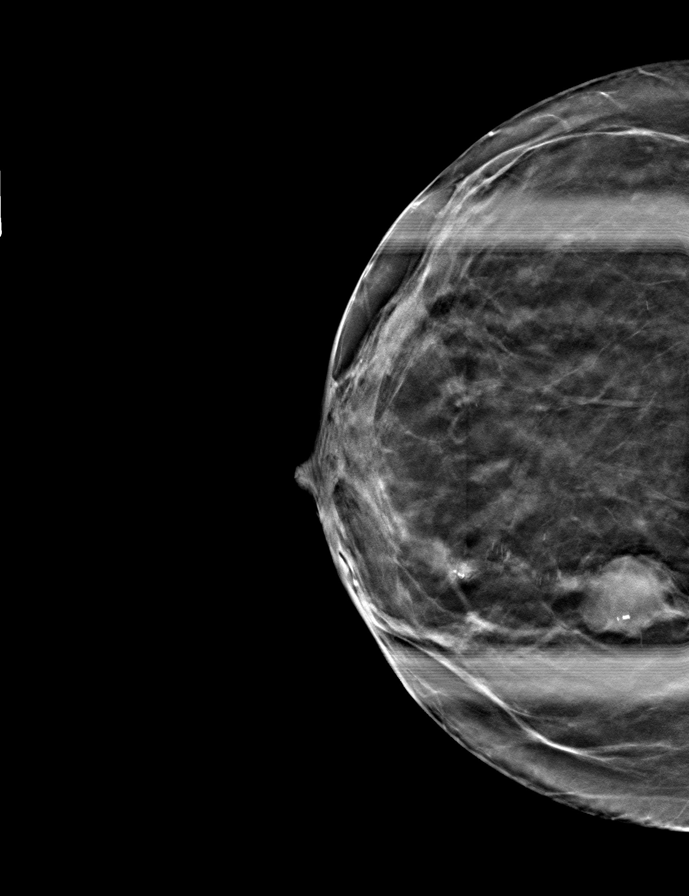

[R CC tomo (5 of 6) · tomo slice 21/40.0]
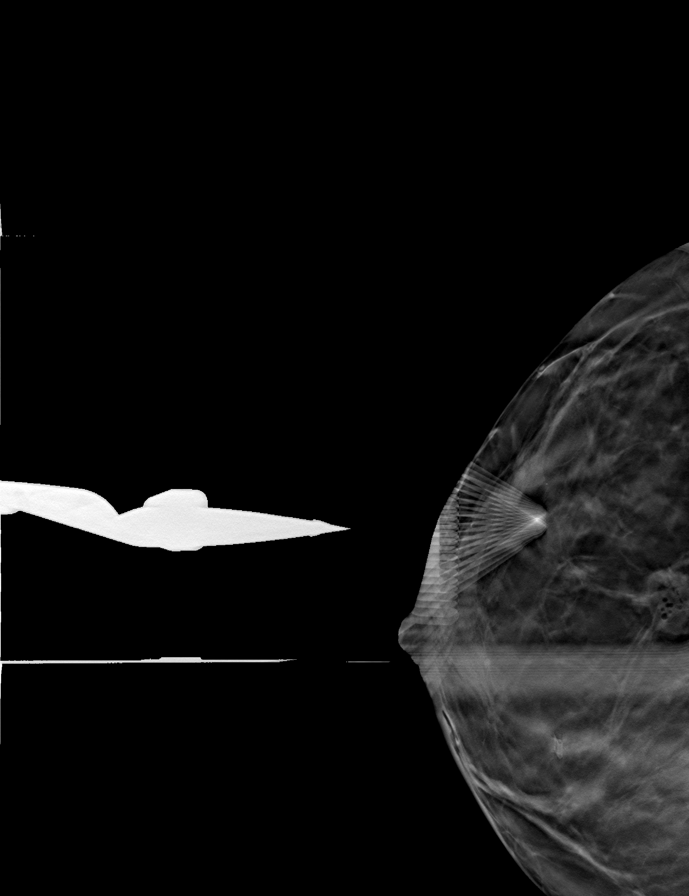

[R CC tomo (6 of 6) · tomo slice 22/43.0]
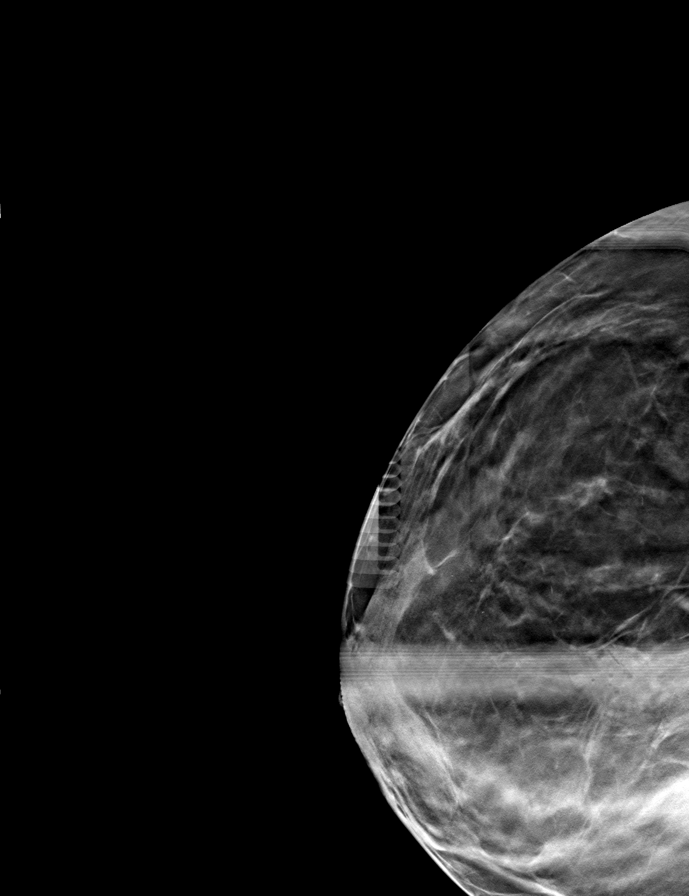

[6 of 35 positions shown; findings below may reference images not displayed]

Diagnostic mammogram dated 10/20/2021 identified 3 additional groups
of indeterminate calcifications within the outer RIGHT breast for
which patient presents today for stereotactic biopsies. One of these
groups of calcifications may correspond to the indeterminate 1.1 cm
clumped non mass enhancement in the lateral anterior RIGHT breast
described on MRI report of 10/18/2021.

EXAM:
RIGHT BREAST STEREOTACTIC CORE NEEDLE BIOPSY x3
FINDINGS: The patient and I discussed the procedure of stereotactic-guided
biopsy including benefits and alternatives. We discussed the high
likelihood of a successful procedure. We discussed the risks of the
procedure including infection, bleeding, tissue injury, clip
migration, and inadequate sampling. Informed written consent was
given. The usual time out protocol was performed immediately prior
to the procedure.

Site 1:

Using sterile technique and 1% Lidocaine as local anesthetic, under
stereotactic guidance, a 9 gauge vacuum assisted device was used to
perform core needle biopsy of calcifications in the outer RIGHT
breast, at MIDDLE depth, using a superior approach. Specimen
radiograph was performed showing calcifications. Specimens with
calcifications are identified for pathology.

Lesion quadrant: Lower outer quadrant

At the conclusion of the procedure, X shaped tissue marker clip was
deployed into the biopsy cavity.

Site 2:

Using sterile technique and 1% Lidocaine as local anesthetic, under
stereotactic guidance, a 9 gauge vacuum assisted device was used to
perform core needle biopsy of calcifications in the outer RIGHT
breast, at POSTERIOR depth, using a superior approach. Specimen
radiograph was performed showing calcifications. Specimens with
calcifications are identified for pathology.

Lesion quadrant: Upper outer quadrant

At the conclusion of the procedure, ribbon shaped tissue marker clip
was deployed into the biopsy cavity.

Site 3:

Using sterile technique and 1% Lidocaine as local anesthetic, under
stereotactic guidance, a 9 gauge vacuum assisted device was used to
perform core needle biopsy of calcifications in the outer RIGHT
breast, at ANTERIOR depth, using a superior approach. Specimen
radiograph was performed showing calcifications. Specimens with
calcifications are identified for pathology.

Lesion quadrant: Upper outer quadrant

At the conclusion of the procedure, coil shaped tissue marker clip
was deployed into the biopsy cavity.

Follow-up 2-view mammogram was performed and dictated separately.
IMPRESSION: 1. Stereotactic-guided biopsy of indeterminate calcifications within
the outer RIGHT breast, at MIDDLE depth, with X shaped clip placed
at the biopsy site. Small-to-moderate postprocedural hematoma at the
biopsy site. Hemostasis obtained with manual compression. No other
apparent complications.
2. Stereotactic-guided biopsy of indeterminate calcifications within
the outer RIGHT breast, at POSTERIOR depth, with ribbon shaped clip
placed at the biopsy site. No apparent complications.
3. Stereotactic-guided biopsy of indeterminate calcifications within
the outer RIGHT breast, at ANTERIOR depth, with coil shaped clip
placed at the biopsy site. No apparent complications.

ADDENDUM:
Pathology revealed FIBROCYSTIC CHANGES WITH CALCIFICATIONS- NO
MALIGNANCY IDENTIFIED of the RIGHT breast, outer middle depth, (X
clip). This was found to be concordant by Dr. Caslavsky Galia.

Pathology revealed FOCAL ATYPICAL LOBULAR HYPERPLASIA WITH
CALCIFICATIONS of the RIGHT breast, outer, posterior depth (ribbon
clip). This was found to be concordant by Dr. Caslavsky Galia, with
excision recommended.

Pathology revealed INTERMEDIATE GRADE DUCTAL CARCINOMA IN SITU WITH
CALCIFICATIONS of the RIGHT breast, anterior depth (coil clip). This
was found to be concordant by Dr. Caslavsky Galia. Note: This site of
DCIS is approximately 3 cm anterior-lateral to the anterior aspect
of DCIS biopsied on 10/02/2021, extending the overall extent to
greater than 9 cm.

Pathology results were discussed with the patient and family by
telephone. The patient reported doing well after the biopsies with
tenderness at the sites. Post biopsy instructions and care were
reviewed and questions were answered. The patient was encouraged to
call The [REDACTED] for any additional
concerns.

ADDITIONAL RECOMMENDATIONS: If breast conservation surgery is
considered for the RIGHT breast, recommend repeat breast MRI to
assess biopsy clip locations relative to the indeterminate 1.1 cm
area of clumped non-mass enhancement in the lateral anterior right
breast described on breast MRI report of 10/18/2021. If the clips did
not correspond to this non-mass enhancement, the non-mass
enhancement could represent additional disease in the RIGHT breast.

The patient has a recent diagnosis of right breast cancer (inner
RIGHT breast) and should follow her outlined treatment plan.

Pathology results reported by Herve Francis Palais RN on 11/03/2021.

*** End of Addendum ***
Diagnostic mammogram dated 10/20/2021 identified 3 additional groups
of indeterminate calcifications within the outer RIGHT breast for
which patient presents today for stereotactic biopsies. One of these
groups of calcifications may correspond to the indeterminate 1.1 cm
clumped non mass enhancement in the lateral anterior RIGHT breast
described on MRI report of 10/18/2021.

EXAM:
RIGHT BREAST STEREOTACTIC CORE NEEDLE BIOPSY x3
FINDINGS: The patient and I discussed the procedure of stereotactic-guided
biopsy including benefits and alternatives. We discussed the high
likelihood of a successful procedure. We discussed the risks of the
procedure including infection, bleeding, tissue injury, clip
migration, and inadequate sampling. Informed written consent was
given. The usual time out protocol was performed immediately prior
to the procedure.

Site 1:

Using sterile technique and 1% Lidocaine as local anesthetic, under
stereotactic guidance, a 9 gauge vacuum assisted device was used to
perform core needle biopsy of calcifications in the outer RIGHT
breast, at MIDDLE depth, using a superior approach. Specimen
radiograph was performed showing calcifications. Specimens with
calcifications are identified for pathology.

Lesion quadrant: Lower outer quadrant

At the conclusion of the procedure, X shaped tissue marker clip was
deployed into the biopsy cavity.

Site 2:

Using sterile technique and 1% Lidocaine as local anesthetic, under
stereotactic guidance, a 9 gauge vacuum assisted device was used to
perform core needle biopsy of calcifications in the outer RIGHT
breast, at POSTERIOR depth, using a superior approach. Specimen
radiograph was performed showing calcifications. Specimens with
calcifications are identified for pathology.

Lesion quadrant: Upper outer quadrant

At the conclusion of the procedure, ribbon shaped tissue marker clip
was deployed into the biopsy cavity.

Site 3:

Using sterile technique and 1% Lidocaine as local anesthetic, under
stereotactic guidance, a 9 gauge vacuum assisted device was used to
perform core needle biopsy of calcifications in the outer RIGHT
breast, at ANTERIOR depth, using a superior approach. Specimen
radiograph was performed showing calcifications. Specimens with
calcifications are identified for pathology.

Lesion quadrant: Upper outer quadrant

At the conclusion of the procedure, coil shaped tissue marker clip
was deployed into the biopsy cavity.

Follow-up 2-view mammogram was performed and dictated separately.
IMPRESSION: 1. Stereotactic-guided biopsy of indeterminate calcifications within
the outer RIGHT breast, at MIDDLE depth, with X shaped clip placed
at the biopsy site. Small-to-moderate postprocedural hematoma at the
biopsy site. Hemostasis obtained with manual compression. No other
apparent complications.
2. Stereotactic-guided biopsy of indeterminate calcifications within
the outer RIGHT breast, at POSTERIOR depth, with ribbon shaped clip
placed at the biopsy site. No apparent complications.
3. Stereotactic-guided biopsy of indeterminate calcifications within
the outer RIGHT breast, at ANTERIOR depth, with coil shaped clip
placed at the biopsy site. No apparent complications.

## 2023-07-08 ENCOUNTER — Other Ambulatory Visit: Payer: Self-pay | Admitting: Family Medicine

## 2023-07-08 DIAGNOSIS — M25649 Stiffness of unspecified hand, not elsewhere classified: Secondary | ICD-10-CM

## 2023-07-09 ENCOUNTER — Ambulatory Visit: Payer: Medicare Other

## 2023-07-09 DIAGNOSIS — M25649 Stiffness of unspecified hand, not elsewhere classified: Secondary | ICD-10-CM

## 2023-07-09 DIAGNOSIS — M25641 Stiffness of right hand, not elsewhere classified: Secondary | ICD-10-CM | POA: Diagnosis not present

## 2023-07-09 DIAGNOSIS — M25642 Stiffness of left hand, not elsewhere classified: Secondary | ICD-10-CM | POA: Diagnosis not present
